# Patient Record
Sex: Female | Born: 1950 | Race: White | Marital: Married
Health system: Northeastern US, Academic
[De-identification: ages and names within clinical notes are randomized; demographics above are authoritative.]

## PROBLEM LIST (undated history)

## (undated) DIAGNOSIS — K449 Diaphragmatic hernia without obstruction or gangrene: Secondary | ICD-10-CM

## (undated) DIAGNOSIS — K219 Gastro-esophageal reflux disease without esophagitis: Secondary | ICD-10-CM

## (undated) DIAGNOSIS — R194 Change in bowel habit: Secondary | ICD-10-CM

## (undated) DIAGNOSIS — J45909 Unspecified asthma, uncomplicated: Secondary | ICD-10-CM

## (undated) DIAGNOSIS — G4730 Sleep apnea, unspecified: Secondary | ICD-10-CM

## (undated) DIAGNOSIS — F32A Depression, unspecified: Secondary | ICD-10-CM

## (undated) DIAGNOSIS — H409 Unspecified glaucoma: Secondary | ICD-10-CM

## (undated) DIAGNOSIS — Z5189 Encounter for other specified aftercare: Secondary | ICD-10-CM

## (undated) DIAGNOSIS — F419 Anxiety disorder, unspecified: Secondary | ICD-10-CM

## (undated) DIAGNOSIS — I639 Cerebral infarction, unspecified: Secondary | ICD-10-CM

## (undated) DIAGNOSIS — IMO0002 Reserved for concepts with insufficient information to code with codable children: Secondary | ICD-10-CM

## (undated) DIAGNOSIS — D649 Anemia, unspecified: Secondary | ICD-10-CM

## (undated) DIAGNOSIS — T7840XA Allergy, unspecified, initial encounter: Secondary | ICD-10-CM

## (undated) DIAGNOSIS — F319 Bipolar disorder, unspecified: Secondary | ICD-10-CM

## (undated) DIAGNOSIS — M1990 Unspecified osteoarthritis, unspecified site: Secondary | ICD-10-CM

## (undated) DIAGNOSIS — F329 Major depressive disorder, single episode, unspecified: Secondary | ICD-10-CM

## (undated) DIAGNOSIS — E079 Disorder of thyroid, unspecified: Secondary | ICD-10-CM

## (undated) HISTORY — DX: Reserved for concepts with insufficient information to code with codable children: IMO0002

## (undated) HISTORY — DX: Cerebral infarction, unspecified: I63.9

## (undated) HISTORY — DX: Allergy, unspecified, initial encounter: T78.40XA

## (undated) HISTORY — PX: SMALL INTESTINE SURGERY: SHX150

## (undated) HISTORY — DX: Anemia, unspecified: D64.9

## (undated) HISTORY — DX: Anxiety disorder, unspecified: F41.9

## (undated) HISTORY — DX: Unspecified osteoarthritis, unspecified site: M19.90

## (undated) HISTORY — PX: CHOLECYSTECTOMY: SHX55

## (undated) HISTORY — PX: TUBAL LIGATION: SHX77

## (undated) HISTORY — DX: Unspecified glaucoma: H40.9

## (undated) HISTORY — DX: Major depressive disorder, single episode, unspecified: F32.9

## (undated) HISTORY — DX: Depression, unspecified: F32.A

## (undated) HISTORY — PX: ABDOMINAL HYSTERECTOMY: SHX81

## (undated) HISTORY — DX: Encounter for other specified aftercare: Z51.89

## (undated) HISTORY — DX: Sleep apnea, unspecified: G47.30

## (undated) HISTORY — DX: Gastro-esophageal reflux disease without esophagitis: K21.9

## (undated) HISTORY — DX: Diaphragmatic hernia without obstruction or gangrene: K44.9

## (undated) HISTORY — DX: Change in bowel habit: R19.4

---

## 2016-06-21 DIAGNOSIS — K5669 Other intestinal obstruction: Secondary | ICD-10-CM | POA: Diagnosis not present

## 2016-06-21 DIAGNOSIS — G4700 Insomnia, unspecified: Secondary | ICD-10-CM | POA: Diagnosis not present

## 2016-06-21 DIAGNOSIS — F17200 Nicotine dependence, unspecified, uncomplicated: Secondary | ICD-10-CM | POA: Diagnosis not present

## 2016-06-21 DIAGNOSIS — Z79899 Other long term (current) drug therapy: Secondary | ICD-10-CM | POA: Diagnosis not present

## 2016-06-21 DIAGNOSIS — F3112 Bipolar disorder, current episode manic without psychotic features, moderate: Secondary | ICD-10-CM | POA: Diagnosis not present

## 2016-06-22 ENCOUNTER — Emergency Department (HOSPITAL_COMMUNITY): Payer: Medicare Other

## 2016-06-22 ENCOUNTER — Encounter (HOSPITAL_COMMUNITY): Payer: Self-pay | Admitting: *Deleted

## 2016-06-22 ENCOUNTER — Emergency Department (HOSPITAL_COMMUNITY)
Admission: EM | Admit: 2016-06-22 | Discharge: 2016-06-25 | Disposition: A | Discharge status: Other Acute Inpatient Hospital | Payer: Medicare Other | Attending: Internal Medicine | Admitting: Internal Medicine

## 2016-06-22 DIAGNOSIS — R109 Unspecified abdominal pain: Secondary | ICD-10-CM

## 2016-06-22 DIAGNOSIS — K56609 Unspecified intestinal obstruction, unspecified as to partial versus complete obstruction: Secondary | ICD-10-CM

## 2016-06-22 DIAGNOSIS — F3112 Bipolar disorder, current episode manic without psychotic features, moderate: Secondary | ICD-10-CM | POA: Diagnosis present

## 2016-06-22 DIAGNOSIS — G4700 Insomnia, unspecified: Secondary | ICD-10-CM

## 2016-06-22 HISTORY — DX: Bipolar disorder, unspecified: F31.9

## 2016-06-22 HISTORY — DX: Unspecified asthma, uncomplicated: J45.909

## 2016-06-22 HISTORY — DX: Disorder of thyroid, unspecified: E07.9

## 2016-06-22 LAB — RAPID URINE DRUG SCREEN, HOSP PERFORMED
Amphetamines: NOT DETECTED
Barbiturates: NOT DETECTED
Benzodiazepines: NOT DETECTED
Cocaine: NOT DETECTED
Opiates: NOT DETECTED
Tetrahydrocannabinol: NOT DETECTED

## 2016-06-22 LAB — CBC
HCT: 34.2 % — ABNORMAL LOW (ref 36.0–46.0)
HEMOGLOBIN: 11.4 g/dL — AB (ref 12.0–15.0)
MCH: 30.6 pg (ref 26.0–34.0)
MCHC: 33.3 g/dL (ref 30.0–36.0)
MCV: 91.9 fL (ref 78.0–100.0)
PLATELETS: 201 10*3/uL (ref 150–400)
RBC: 3.72 MIL/uL — AB (ref 3.87–5.11)
RDW: 14.7 % (ref 11.5–15.5)
WBC: 6 10*3/uL (ref 4.0–10.5)

## 2016-06-22 LAB — COMPREHENSIVE METABOLIC PANEL
ALBUMIN: 4.2 g/dL (ref 3.5–5.0)
ALK PHOS: 94 U/L (ref 38–126)
ALT: 47 U/L (ref 14–54)
ANION GAP: 10 (ref 5–15)
AST: 62 U/L — ABNORMAL HIGH (ref 15–41)
BUN: 7 mg/dL (ref 6–20)
CALCIUM: 9.9 mg/dL (ref 8.9–10.3)
CO2: 24 mmol/L (ref 22–32)
Chloride: 105 mmol/L (ref 101–111)
Creatinine, Ser: 1.03 mg/dL — ABNORMAL HIGH (ref 0.44–1.00)
GFR calc non Af Amer: 56 mL/min — ABNORMAL LOW (ref >60)
GLUCOSE: 119 mg/dL — AB (ref 65–99)
POTASSIUM: 3.7 mmol/L (ref 3.5–5.1)
SODIUM: 139 mmol/L (ref 135–145)
TOTAL PROTEIN: 6.7 g/dL (ref 6.5–8.1)
Total Bilirubin: 0.6 mg/dL (ref 0.3–1.2)

## 2016-06-22 LAB — I-STAT TROPONIN, ED: TROPONIN I, POC: 0 ng/mL (ref 0.00–0.08)

## 2016-06-22 LAB — LITHIUM LEVEL: LITHIUM LVL: 0.74 mmol/L (ref 0.60–1.20)

## 2016-06-22 LAB — ETHANOL: Alcohol, Ethyl (B): <5 mg/dL (ref <5)

## 2016-06-22 MED ORDER — LORAZEPAM 1 MG PO TABS: 1.0000 mg | ORAL_TABLET | Freq: Once | ORAL | Status: DC

## 2016-06-22 MED ORDER — ADULT MULTIVITAMIN W/MINERALS CH
1.0000 | ORAL_TABLET | Freq: Every day | ORAL | Status: DC
  Administered 2016-06-22 – 2016-06-25 (×4): 1 via ORAL
  Filled 2016-06-22 (×4): qty 1

## 2016-06-22 MED ORDER — OLANZAPINE 5 MG PO TBDP
5.0000 mg | ORAL_TABLET | Freq: Once | ORAL | Status: AC
  Administered 2016-06-22: 5 mg via ORAL
  Filled 2016-06-22: qty 1

## 2016-06-22 MED ORDER — CALCIUM CARBONATE-VITAMIN D 500-200 MG-UNIT PO TABS
1.0000 | ORAL_TABLET | Freq: Every day | ORAL | Status: DC
  Administered 2016-06-22 – 2016-06-25 (×4): 1 via ORAL
  Filled 2016-06-22 (×8): qty 1

## 2016-06-22 MED ORDER — DIPHENHYDRAMINE HCL 25 MG PO CAPS
25.0000 mg | ORAL_CAPSULE | Freq: Once | ORAL | Status: AC
  Administered 2016-06-22: 25 mg via ORAL
  Filled 2016-06-22: qty 1

## 2016-06-22 MED ORDER — LEVOTHYROXINE SODIUM 137 MCG PO TABS
137.0000 ug | ORAL_TABLET | Freq: Every day | ORAL | Status: DC
  Administered 2016-06-22 – 2016-06-25 (×4): 137 ug via ORAL
  Filled 2016-06-22 (×8): qty 1

## 2016-06-22 MED ORDER — TRAZODONE HCL 100 MG PO TABS
200.0000 mg | ORAL_TABLET | Freq: Every day | ORAL | Status: DC
  Administered 2016-06-23 – 2016-06-24 (×3): 200 mg via ORAL
  Filled 2016-06-22 (×4): qty 2

## 2016-06-22 MED ORDER — LITHIUM CARBONATE ER 450 MG PO TBCR
450.0000 mg | EXTENDED_RELEASE_TABLET | Freq: Two times a day (BID) | ORAL | Status: DC
  Administered 2016-06-22 – 2016-06-24 (×5): 450 mg via ORAL
  Filled 2016-06-22 (×7): qty 1
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
## 2016-06-22 NOTE — ED Provider Notes (Signed)
On my assessment, this morning, patient is requesting discharge. She is tangential, with pressured speech and flight of ideas. She shows little to no insight as to her current condition and swears that she has no "mental issues." Per review of Behavioral Health records, patient does meet inpatient criteria. Will place IVC and continue seeking inpatient care.  Patient IVC'ed. She has been accepted to an outside facility, so chest x-ray and EKG obtained. Of note, EKG shows mildly prolonged QT as well as ST elevations in V1 and V2. There are no reciprocal changes. This may be secondary to QT prolongation versus chronic changes versus lithium. I discussed with Dr. Delton SeeNelson of cardiology. Repeat EKG is unchanged with the exception of continued prolonged QTC. Trop negative and no further cardiology work-up required per Dr. Delton SeeNelson.   Shaune Pollackameron Lorrain Rivers, MD 06/22/16 1556

## 2016-06-22 NOTE — ED Triage Notes (Signed)
The pt is here with family because the pt has b not been able to sleep since she left a [facility] one wek ago.  She has been restless and feels sleepy but cannot rest

## 2016-06-22 NOTE — ED Notes (Signed)
[facility] of [facility] MD plans to [facility] patient.

## 2016-06-22 NOTE — ED Notes (Signed)
Pt given dinner tray.  Sitting in chair eating.  Sitter at bedside.

## 2016-06-22 NOTE — ED Notes (Signed)
Pt changed out and security paged to wand pt

## 2016-06-22 NOTE — ED Notes (Signed)
Patient had breakfast. Is resting at current. RN able to visualize patient, unlabored respirations.

## 2016-06-22 NOTE — ED Notes (Signed)
Pt reports allergy to ativan that makes her hallucinate. Order d/c.
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
## 2016-06-22 NOTE — ED Notes (Signed)
Staffing made aware of need for sitter. Dr. Erma HeritageIsaacs is taking out IVC papers.

## 2016-06-22 NOTE — [facility] Counselor (Signed)
Attempted to reassess Pt.  She was sleeping after being given benadryl.  Per attending nurse, Pt reported to have up for four days.  Pt is now under IVC (petition by attending physician) due to incoherent thought.

## 2016-06-22 NOTE — ED Triage Notes (Signed)
PT HAVING DIARRHEA AND VOMITING

## 2016-06-22 NOTE — BH Assessment (Signed)
Assessor sent inpatient referrals to the following for review: [facility], [facility], [address], [facility], [place], [facility].  Princess BruinsAquicha Duff, MSW, Theresia MajorsLCSWA

## 2016-06-22 NOTE — ED Notes (Signed)
Meal tray delivered.

## 2016-06-22 NOTE — ED Notes (Signed)
Pt complaining to RN she can't sleep, is exhausted. Is yelling, agitated easily with questioning. Dr. Rhunette CroftNanavati provided verbal order for anxiety medication.

## 2016-06-22 NOTE — ED Provider Notes (Signed)
[facility]-EMERGENCY DEPT Provider Note   CSN: [id number] Arrival date & time: 06/21/16  2350     History   Chief Complaint Chief Complaint  Patient presents with  . Psychiatric Evaluation    HPI Lydia Morgan is a 65 y.o. female.  65 year old female with a history of bipolar 1 disorder and thyroid disease presents to the emergency department for evaluation of insomnia. She reports that she has not slept in 4 days. She is on lithium, Effexor, trazodone, and O thyroxine. She reports compliance with these medications. She was recently discharged from a [facility] in [place] where she spent one week. She has been able to doze off for a few minutes, but then will wake up. She denies SI/HI and illicit drug use or ETOH use. Family reports that the patient has been increasingly combative with her son with whom she lives. She c/o diarrhea and nausea/vomiting, but these have been chronic complaints of the patient for "years".   The history is provided by the patient. No language interpreter was used.    Past Medical History:  Diagnosis Date  . Bipolar 1 disorder (HCC)   . Thyroid disease     There are no active problems to display for this patient.   History reviewed. No pertinent surgical history.  OB History    No data available       Home Medications    Prior to Admission medications   Medication Sig Start Date End Date Taking? Authorizing Provider  calcium-vitamin D (OSCAL WITH D) 500-200 MG-UNIT tablet Take 1 tablet by mouth every morning.   Yes Historical Provider, MD  levothyroxine (SYNTHROID, LEVOTHROID) 137 MCG tablet Take 137 mcg by mouth daily before breakfast.   Yes Historical Provider, MD  lithium carbonate (ESKALITH) 450 MG CR tablet Take 450-675 mg by mouth 2 (two) times daily. 450mg  in the morning and 675mg  in the evening.   Yes Historical Provider, MD  Multiple Vitamin (MULTIVITAMIN WITH MINERALS) TABS tablet Take 1 tablet by mouth daily.   Yes  Historical Provider, MD  traZODone (DESYREL) 100 MG tablet Take 200 mg by mouth at bedtime.   Yes Historical Provider, MD    Family History No family history on file.  Social History Social History  Substance Use Topics  . Smoking status: Current Every Day Smoker  . Smokeless tobacco: Never Used  . Alcohol use Not on file     Allergies   Ambien [zolpidem tartrate]   Review of Systems Review of Systems  Psychiatric/Behavioral: Positive for behavioral problems and sleep disturbance.  Ten systems reviewed and are negative for acute change, except as noted in the HPI.     Physical Exam Updated Vital Signs BP 118/61   Pulse 61   Temp 97.9 F (36.6 C) (Oral)   Resp 16   Ht 5\' 7"  (1.702 m)   Wt 71.7 kg   SpO2 98%   BMI 24.75 kg/m   Physical Exam  Constitutional: She is oriented to person, place, and time. She appears well-developed and well-nourished. No distress.  Nontoxic appearing  HENT:  Head: Normocephalic and atraumatic.  Eyes: Conjunctivae and EOM are normal. No scleral icterus.  Neck: Normal range of motion.  Cardiovascular: Normal rate, regular rhythm and intact distal pulses.   Pulmonary/Chest: Effort normal. No respiratory distress.  Abdominal: Soft. She exhibits no distension. There is no tenderness. There is no guarding.  Soft, nontender abdomen.  Musculoskeletal: Normal range of motion.  Neurological: She is alert and oriented  to person, place, and time.  GCS 15. Patient moving all extremities.  Skin: Skin is warm and dry. No rash noted. She is not diaphoretic. No erythema. No pallor.  Psychiatric: Her speech is normal. She is agitated (mild). She expresses no homicidal and no suicidal ideation.  Nursing note and vitals reviewed.    ED Treatments / Results  Labs (all labs ordered are listed, but only abnormal results are displayed) Labs Reviewed  COMPREHENSIVE METABOLIC PANEL - Abnormal; Notable for the following:       Result Value   Glucose,  Bld 119 (*)    Creatinine, Ser 1.03 (*)    AST 62 (*)    GFR calc non Af Amer 56 (*)    All other components within normal limits  CBC - Abnormal; Notable for the following:    RBC 3.72 (*)    Hemoglobin 11.4 (*)    HCT 34.2 (*)    All other components within normal limits  ETHANOL  URINE RAPID DRUG SCREEN, HOSP PERFORMED  LITHIUM LEVEL    EKG  EKG Interpretation None       Radiology No results found.  Procedures Procedures (including critical care time)  Medications Ordered in ED Medications - No data to display   Initial Impression / Assessment and Plan / ED Course  I have reviewed the triage vital signs and the nursing notes.  Pertinent labs & imaging results that were available during my care of the patient were reviewed by me and considered in my medical decision making (see chart for details).  Clinical Course    Patient medically cleared. She has been evaluated by TTS and meets inpatient criteria. Placement pending. Disposition to be determined by oncoming ED provider.   Final Clinical Impressions(s) / ED Diagnoses   Final diagnoses:  Insomnia    New Prescriptions New Prescriptions   No medications on file     Antony MaduraKelly Roberta Angell, PA-C 06/22/16 [id number]    Derwood KaplanAnkit Nanavati, MD 06/22/16 [phone]

## 2016-06-22 NOTE — ED Notes (Signed)
Dr. Erma HeritageIsaacs in to bedside to with patient

## 2016-06-22 NOTE — ED Notes (Signed)
[facility] requesting EKG and Chest xray, which will increase her eligibility for placement in geriatric facilities. Dr. Erma HeritageIsaacs agreeable.

## 2016-06-23 ENCOUNTER — Encounter (HOSPITAL_COMMUNITY): Payer: Self-pay | Admitting: Radiology

## 2016-06-23 ENCOUNTER — Emergency Department (HOSPITAL_COMMUNITY): Payer: Medicare Other

## 2016-06-23 DIAGNOSIS — R109 Unspecified abdominal pain: Secondary | ICD-10-CM | POA: Diagnosis present

## 2016-06-23 DIAGNOSIS — K56609 Unspecified intestinal obstruction, unspecified as to partial versus complete obstruction: Secondary | ICD-10-CM | POA: Diagnosis present

## 2016-06-23 LAB — CBC WITH DIFFERENTIAL/PLATELET
BASOS PCT: 0 %
Basophils Absolute: 0 10*3/uL (ref 0.0–0.1)
EOS ABS: 0.2 10*3/uL (ref 0.0–0.7)
Eosinophils Relative: 4 %
HCT: 34.1 % — ABNORMAL LOW (ref 36.0–46.0)
HEMOGLOBIN: 11.4 g/dL — AB (ref 12.0–15.0)
Lymphocytes Relative: 26 %
Lymphs Abs: 1.7 10*3/uL (ref 0.7–4.0)
MCH: 30.8 pg (ref 26.0–34.0)
MCHC: 33.4 g/dL (ref 30.0–36.0)
MCV: 92.2 fL (ref 78.0–100.0)
Monocytes Absolute: 0.6 10*3/uL (ref 0.1–1.0)
Monocytes Relative: 9 %
NEUTROS PCT: 61 %
Neutro Abs: 4 10*3/uL (ref 1.7–7.7)
Platelets: 194 10*3/uL (ref 150–400)
RBC: 3.7 MIL/uL — AB (ref 3.87–5.11)
RDW: 15 % (ref 11.5–15.5)
WBC: 6.5 10*3/uL (ref 4.0–10.5)

## 2016-06-23 LAB — COMPREHENSIVE METABOLIC PANEL
ALBUMIN: 3.7 g/dL (ref 3.5–5.0)
ALK PHOS: 101 U/L (ref 38–126)
ALT: 40 U/L (ref 14–54)
ANION GAP: 6 (ref 5–15)
AST: 47 U/L — ABNORMAL HIGH (ref 15–41)
BUN: 10 mg/dL (ref 6–20)
CALCIUM: 8.7 mg/dL — AB (ref 8.9–10.3)
CHLORIDE: 109 mmol/L (ref 101–111)
CO2: 21 mmol/L — AB (ref 22–32)
Creatinine, Ser: 0.93 mg/dL (ref 0.44–1.00)
GFR calc Af Amer: >60 mL/min (ref >60)
GFR calc non Af Amer: >60 mL/min (ref >60)
GLUCOSE: 169 mg/dL — AB (ref 65–99)
Potassium: 3.5 mmol/L (ref 3.5–5.1)
SODIUM: 136 mmol/L (ref 135–145)
Total Bilirubin: 0.3 mg/dL (ref 0.3–1.2)
Total Protein: 5.8 g/dL — ABNORMAL LOW (ref 6.5–8.1)

## 2016-06-23 MED ORDER — SODIUM CHLORIDE 0.9 % IV BOLUS (SEPSIS)
1000.0000 mL | Freq: Once | INTRAVENOUS | Status: AC
  Administered 2016-06-23: 1000 mL via INTRAVENOUS

## 2016-06-23 MED ORDER — IOPAMIDOL (ISOVUE-300) INJECTION 61%
INTRAVENOUS | Status: AC
  Administered 2016-06-23: 100 mL
  Filled 2016-06-23: qty 100

## 2016-06-23 MED ORDER — ONDANSETRON HCL 4 MG/2ML IJ SOLN
4.0000 mg | Freq: Once | INTRAMUSCULAR | Status: AC
  Administered 2016-06-23: 4 mg via INTRAVENOUS
  Filled 2016-06-23: qty 2

## 2016-06-23 NOTE — [facility] Counselor (Signed)
Reassessment   Pt states that she is ready to go home and has been feeling "great, and talking with her sitter". Pt says that she is upset with her kids and her kids dog who has been "chewing on her furniture". She states that's why she hasn't been sleeping- because she needed to "keep watch on her furniture". Pt speech is rapid and she is irritated during reassessment. She states that she is 65 years old and no one helps her. She states her kids call her the "energizer bunny" and she admits that she has a lot of energy. Pt has a history of Bipolar disorder and has been taking Lithium for years she states. She states that [place] and does not currently have a psychiatrist in the area. She states that she has "plenty of medication" left [place] however her children state that she isn't taking her medications. She admits that she thinks her medications might not be working the way they used to because she has been on the same dose for a long time. However she does not want to go inpatient. Pt is currently under IVC and the recommended disposition in inpatient treatment. Pt still meets inpatient criteria today per Lydia KaufmannLaura Davis NP. TTS still seeking treatment due to possible manic episode and need for medication management and stabilization.  [address] LaneKristin Arshia Morgan, LPCA, [place],

## 2016-06-23 NOTE — ED Notes (Signed)
TTS at bedside with patient.   

## 2016-06-23 NOTE — [facility] Counselor (Signed)
[facility] Assessment Progress Note  [facility] Accepting doctor is Saeed. Call report to [phone]. Pt can come anytime after 10am tomorrow (06/24/16). RN, Florentina AddisonKatie, notified.   Johny ShockSamantha M. Ladona Ridgelaylor, MS, NCC, LPCA Counselor

## 2016-06-23 NOTE — ED Notes (Signed)
Patient made 1 - 5 minute phone call.

## 2016-06-23 NOTE — Progress Notes (Signed)
[facility] at [facility] called to advise admitting MD declined pt's referral due to "lack of acute symptoms. Appears to be mania only."

## 2016-06-23 NOTE — ED Notes (Signed)
RN and MD at bedside updating patient.  Patient refusing NG tube at this time.  MD okay'd but will update with any continued vomiting.

## 2016-06-23 NOTE — ED Notes (Signed)
Patient was given graham crackers and peanut butter with diet coke,and a regular diet ordered for lunch.

## 2016-06-23 NOTE — ED Notes (Signed)
Pt vomiting in room.  sts "this happens sometimes when food gets caught".  Pt laying sideways on bed and attempting to force herself to vomit.  Will speak to MD

## 2016-06-23 NOTE — Consult Note (Signed)
Reason for Consult:abdominal pain, possible SBO Referring Physician: Dr. Shirlyn Goltz  Lydia Morgan is an 65 y.o. female.  HPI: I have been asked to evaluate this patient who is been in the emergency department approximately 2 days for a possible bowel obstruction. She actually has chronic abdominal pain with chronic nausea, vomiting, and diarrhea. I was asked to see her because of apparent diffuse abdominal pain and vomiting. Currently, she is awake and alert and denies any abdominal pain. She has a significant history of multiple abdominal procedures in the past. Apparently, she has some kind of bowel stimulator as well. Again, she currently is denying any abdominal pain.  Past Medical History:  Diagnosis Date  . Asthma   . Bipolar 1 disorder ([place])   . Thyroid disease     History reviewed. No pertinent surgical history.  No family history on file.  Social History:  reports that she has been smoking.  She has never used smokeless tobacco. Her alcohol and drug histories are not on file.  Allergies:  Allergies  Allergen Reactions  . Ambien [Zolpidem Tartrate] Other (See Comments)    Hallucinations  . Ativan [Lorazepam] Other (See Comments)    hallucinations    Medications: I have reviewed the patient's current medications.  Results for orders placed or performed during the hospital encounter of 06/22/16 (from the past 48 hour(s))  Rapid urine drug screen (hospital performed)     Status: None   Collection Time: 06/22/16 12:08 AM  Result Value Ref Range   Opiates NONE DETECTED NONE DETECTED   Cocaine NONE DETECTED NONE DETECTED   Benzodiazepines NONE DETECTED NONE DETECTED   Amphetamines NONE DETECTED NONE DETECTED   Tetrahydrocannabinol NONE DETECTED NONE DETECTED   Barbiturates NONE DETECTED NONE DETECTED    Comment:        DRUG SCREEN FOR MEDICAL PURPOSES ONLY.  IF CONFIRMATION IS NEEDED FOR ANY PURPOSE, NOTIFY LAB WITHIN 5 DAYS.        LOWEST DETECTABLE LIMITS FOR  URINE DRUG SCREEN Drug Class       Cutoff (ng/mL) Amphetamine      1000 Barbiturate      200 Benzodiazepine   756 Tricyclics       433 Opiates          300 Cocaine          300 THC              50   Comprehensive metabolic panel     Status: Abnormal   Collection Time: 06/22/16 12:15 AM  Result Value Ref Range   Sodium 139 135 - 145 mmol/L   Potassium 3.7 3.5 - 5.1 mmol/L   Chloride 105 101 - 111 mmol/L   CO2 24 22 - 32 mmol/L   Glucose, Bld 119 (H) 65 - 99 mg/dL   BUN 7 6 - 20 mg/dL   Creatinine, Ser 1.03 (H) 0.44 - 1.00 mg/dL   Calcium 9.9 8.9 - 10.3 mg/dL   Total Protein 6.7 6.5 - 8.1 g/dL   Albumin 4.2 3.5 - 5.0 g/dL   AST 62 (H) 15 - 41 U/L   ALT 47 14 - 54 U/L   Alkaline Phosphatase 94 38 - 126 U/L   Total Bilirubin 0.6 0.3 - 1.2 mg/dL   GFR calc non Af Amer 56 (L) >60 mL/min   GFR calc Af Amer >60 >60 mL/min    Comment: (NOTE) The eGFR has been calculated using the CKD EPI equation. This calculation has  not been validated in all clinical situations. eGFR's persistently <60 mL/min signify possible Chronic Kidney Disease.    Anion gap 10 5 - 15  Ethanol     Status: None   Collection Time: 06/22/16 12:15 AM  Result Value Ref Range   Alcohol, Ethyl (B) <5 <5 mg/dL    Comment:        LOWEST DETECTABLE LIMIT FOR SERUM ALCOHOL IS 5 mg/dL FOR MEDICAL PURPOSES ONLY   cbc     Status: Abnormal   Collection Time: 06/22/16 12:15 AM  Result Value Ref Range   WBC 6.0 4.0 - 10.5 K/uL   RBC 3.72 (L) 3.87 - 5.11 MIL/uL   Hemoglobin 11.4 (L) 12.0 - 15.0 g/dL   HCT 34.2 (L) 36.0 - 46.0 %   MCV 91.9 78.0 - 100.0 fL   MCH 30.6 26.0 - 34.0 pg   MCHC 33.3 30.0 - 36.0 g/dL   RDW 14.7 11.5 - 15.5 %   Platelets 201 150 - 400 K/uL  Lithium level     Status: None   Collection Time: 06/22/16  4:09 AM  Result Value Ref Range   Lithium Lvl 0.74 0.60 - 1.20 mmol/L  I-Stat Troponin, ED (not at [facility])     Status: None   Collection Time: 06/22/16 12:36 PM  Result Value Ref Range    Troponin i, poc 0.00 0.00 - 0.08 ng/mL   Comment 3            Comment: Due to the release kinetics of cTnI, a negative result within the first hours of the onset of symptoms does not rule out myocardial infarction with certainty. If myocardial infarction is still suspected, repeat the test at appropriate intervals.   CBC with Differential/Platelet     Status: Abnormal   Collection Time: 06/23/16  8:06 PM  Result Value Ref Range   WBC 6.5 4.0 - 10.5 K/uL   RBC 3.70 (L) 3.87 - 5.11 MIL/uL   Hemoglobin 11.4 (L) 12.0 - 15.0 g/dL   HCT 34.1 (L) 36.0 - 46.0 %   MCV 92.2 78.0 - 100.0 fL   MCH 30.8 26.0 - 34.0 pg   MCHC 33.4 30.0 - 36.0 g/dL   RDW 15.0 11.5 - 15.5 %   Platelets 194 150 - 400 K/uL   Neutrophils Relative % 61 %   Neutro Abs 4.0 1.7 - 7.7 K/uL   Lymphocytes Relative 26 %   Lymphs Abs 1.7 0.7 - 4.0 K/uL   Monocytes Relative 9 %   Monocytes Absolute 0.6 0.1 - 1.0 K/uL   Eosinophils Relative 4 %   Eosinophils Absolute 0.2 0.0 - 0.7 K/uL   Basophils Relative 0 %   Basophils Absolute 0.0 0.0 - 0.1 K/uL    Dg Chest 2 View  Result Date: 06/22/2016 CLINICAL DATA:  Psychiatric patient.  Altered mental status. EXAM: CHEST  2 VIEW COMPARISON:  None. FINDINGS: Normal cardiac and mediastinal contours. No consolidative pulmonary opacities. No pleural effusion or pneumothorax. Upper abdominal surgical clips. Lysis of the distal left clavicle and widening of the left AC joint. IMPRESSION: No acute cardiopulmonary process. Lysis of the distal left clavicle and widening of the left AC joint, likely sequelae of prior trauma. Recommend correlation with patient history. Electronically Signed   By: Lovey Newcomer M.D.   On: 06/22/2016 11:43   Ct Abdomen Pelvis W Contrast  Result Date: 06/23/2016 CLINICAL DATA:  Abdominal pain and diarrhea EXAM: CT ABDOMEN AND PELVIS WITH CONTRAST TECHNIQUE: Multidetector CT  imaging of the abdomen and pelvis was performed using the standard protocol following  bolus administration of intravenous contrast. CONTRAST:  132m ISOVUE-300 IOPAMIDOL (ISOVUE-300) INJECTION 61% COMPARISON:  None. FINDINGS: Lower chest: Multiple nodules are present in the right lung base. These are subpleural in location and measure 3 - 6 mm in diameter. Approximately 5 nodules in the right lung base are present. There is also some linear scarring in the right posterior lung base with associated calcification. Solitary 3 mm nodule left lower lobe posteriorly. No pleural effusion. Heart size normal. Hepatobiliary: Cholecystectomy. 1 cm round hypodensity left lobe liver probably a cyst. 8 x 15 mm ill-defined hypodensity in the inferior right lobe liver anteriorly adjacent to the liver capsule is indeterminate. No other liver lesions identified. Pancreas: Negative Spleen: Negative Adrenals/Urinary Tract: Normal renal size and contour. Right lower pole cyst 15 mm. No mass or stone in the kidneys. Urinary bladder normal. Stomach/Bowel: Prior gastric surgery with surgical clips. Colectomy. Ileo colic anastomosis appears to be at the level of the sigmoid colon. Dilated loop of small bowel in the left mid abdomen near a suture line. This measures 4 cm in diameter and could represent a closed loop obstruction. This is relatively isolated and the remainder of the small bowel is not dilated or thickened. Vascular/Lymphatic: Negative Reproductive: Hysterectomy.  No pelvic mass. Other: No free fluid.  Negative for hernia. Musculoskeletal: No acute skeletal abnormality. IMPRESSION: Multiple lung nodules, under 7 mm in size. These could be due to metastatic disease or chronic infection. CT of the chest with contrast is recommended to evaluate for other nodules or lung mass. 8 x 15 mm ill-defined hypodensity in the right lower lip liver anteriorly is indeterminate. Probable 1 cm cyst in the left lobe of the liver. Prior gastric surgery. Colectomy with ileocolic anastomosis in the sigmoid colon. Focal short segment  dilated small bowel loop near anastomosis in the left abdomen could represent a closed loop bowel obstruction. Electronically Signed   By: CFranchot GalloM.D.   On: 06/23/2016 19:05    Review of Systems  All other systems reviewed and are negative.  Blood pressure 123/77, pulse 70, temperature 98.5 F (36.9 C), temperature source Oral, resp. rate 19, height '5\' 7"'$  (1.702 m), weight 71.7 kg (158 lb), SpO2 100 %. Physical Exam  Constitutional: She appears well-developed and well-nourished. No distress.  Cardiovascular: Normal rate, regular rhythm and normal heart sounds.   Respiratory: Effort normal and breath sounds normal. No respiratory distress.  GI: Soft. She exhibits no distension. There is no tenderness. There is no rebound and no guarding.  She has multiple well-healed incisions. Her abdomen is completely soft and nontender with no guarding or distention  Skin: She is not diaphoretic.    Assessment/Plan: Abdominal pain, multifactorial  Clinically, she does not have an acute abdomen or an obstruction. I have reviewed the CT scan and I do not believe she has a closed loop obstruction. Again, she is nontender and has a normal white blood count. I believe the dilated bowel represents a side-to-side anastomosis and not an obstruction. There is no dilated bowel proximal to this. Given her abdominal exam, I would not even place an NG tube. I'm not even sure she needs admission. At the most, she needs a repeat abdominal x-ray tomorrow to evaluate for obstructive pattern and the contrast from the CT scan.  Bow Buntyn A 06/23/2016, 8:35 PM

## 2016-06-23 NOTE — Progress Notes (Signed)
Lydia Morgan called and advised referral is being reviewed for possible admission.  Ilean SkillMeghan Abigale Dorow, MSW, LCSW Clinical Social Work, Disposition  06/23/2016 [phone]

## 2016-06-23 NOTE — ED Notes (Signed)
Pt c/o of redness to her bottom; RN assessed with no obvious redness or bruising to buttock; RN applied barrier cream for pt's comfort

## 2016-06-23 NOTE — ED Notes (Signed)
Paged general surgery for Dr. Roderic PalauYao-TY

## 2016-06-23 NOTE — Progress Notes (Signed)
[facility] to call pt's son, Lydia Morgan [phone], who is her POA (per his report) and had questions re: [facility]commendations.   CSW spoke with Mr. Archer AsaHermanowski and relayed that [facility] and that she is under IVC. Son understanding and agrees that he feels pt needs inpatient admission as "[facility] for this same thing. She is not sleeping, doesn't make sense when she talks, does not sit down, never stops moving." Son concerned that medication regimen is ineffective or that "her body is not metabolizing medications right." Son shares concerns about pt "having diarrhea, and urinating on herself" (these symptoms have not presented in ED per ED RN and chart review), "she has a pacemaker in her ?back and it needs to be looked at, something is off with her medically." CSW relayed son's concerns to ED.   Son states pt moved to [place] area 3 weeks ago, (to be nearer family) and that she "had an appointment set up with a psychiatrist this week, can't remember where." States pt has hx of treatment for bipolar d/o, both inpatient and outpatient.  Son understands that, as pt under IVC, POA consent for transfer is not needed, however, he states he is primary support for pt and requests he be called to notify of transfer once inpatient bed is found (or with further plan of care updates).   CSW provided son with contact numbers for CSW and for [facility]. Will continue following case.   Ilean SkillMeghan Kataleya Zaugg, MSW, LCSW Clinical Social Work, Disposition  06/23/2016 [phone]

## 2016-06-23 NOTE — ED Notes (Signed)
Patient on phone with son (Donald) [phone].  Donald asked to speak with this RN.  Advised by son that we wDorinda Hillere not able to move the patient without his consent due to Dorinda HillDonald being patients Power of [employer].  This RN advised Dorinda HillDonald that I would have [facility] consult with him regarding mothers status.    Mayara was upset and crying on the phone, stating "You need to have a family meeting to understand why I am upset and what dog is chewing my furniture".  Requesting son to have him bring clothes so she could change.  Advised son that patient would remain in our scrubs until either discharged or transferred.

## 2016-06-23 NOTE — Progress Notes (Addendum)
Referred pt for inpatient treatment, recommended by TTS 9/17.   [facility]- sent referral and left voicemail for intake [facility]- left voicemail inquiring as to status of referral sent 9/17 [facility]- per Cece, no beds currently but fax for review for waiting list [facility]- per Jamesetta SoPhyllis [facility]- per Adolph Pollackrent  Perla Echavarria, MSW, LCSW Clinical Social Work, Disposition  06/23/2016 [phone]

## 2016-06-23 NOTE — ED Notes (Signed)
Patient and this RN spoke to pt's niece.  Asked she be added to chart to accept phone calls and be given updates

## 2016-06-23 NOTE — ED Provider Notes (Addendum)
  Physical Exam  BP 126/75 (BP Location: Left Arm)   Pulse 68   Temp 98.5 F (36.9 C) (Oral)   Resp 20   Ht 5\' 7"  (1.702 m)   Wt 158 lb (71.7 kg)   SpO2 100%   BMI 24.75 kg/m   Physical Exam  ED Course  Procedures  MDM I was called around 4pm regarding patient. She started vomiting and has chronic diarrhea. She also has abdominal pain. She state that she has a "stimulator for her bowels" that is not working well. Will get CT ab/pel to assess the location of the stimulator and r/o SBO. She has hx of gastric bypass.   7:55 PM CT showed possible small loop obstruction. I consulted Dr. Magnus IvanBlackman from surgery. He reviewed images and doesn't think she has small loop obstruction. Recommend NG tube. Patient refused NG tube. Given IVF, zofran. Repeat labs. He recommend medical admission and repeat xray tomorrow.   11:03 PM Dr. Magnus IvanBlackman saw patient. Felt that the CT changes are chronic and recommend repeat xray tomorrow morning. Dr. Toniann FailKakrakandy accepted patient initially but won't admit given Dr. Eliberto IvoryBlackman's recommendation. Will get xray at 7am and if stable, still proceed with current plan. Will let overnight doctor, Dr. Blinda LeatherwoodPollina aware.    Charlynne Panderavid Hsienta Yao, MD 06/23/16 1956    Charlynne Panderavid Hsienta Yao, MD 06/23/16 [phone]

## 2016-06-23 NOTE — ED Notes (Signed)
MD at bedside updating pt

## 2016-06-23 NOTE — ED Notes (Signed)
[facility] that we need to call son, Dorinda HillDonald back regarding patients status and give an update on son's concerns of mother.

## 2016-06-24 ENCOUNTER — Inpatient Hospital Stay (HOSPITAL_COMMUNITY): Payer: Medicare Other

## 2016-06-24 MED ORDER — TRAZODONE HCL 100 MG PO TABS: 200.0000 mg | ORAL_TABLET | Freq: Every day | ORAL | Status: DC

## 2016-06-24 MED ORDER — LITHIUM CARBONATE ER 450 MG PO TBCR
675.0000 mg | EXTENDED_RELEASE_TABLET | Freq: Every evening | ORAL | Status: DC
  Administered 2016-06-24: 675 mg via ORAL

## 2016-06-24 MED ORDER — LITHIUM CARBONATE ER 450 MG PO TBCR
450.0000 mg | EXTENDED_RELEASE_TABLET | Freq: Every day | ORAL | Status: DC
  Administered 2016-06-25: 450 mg via ORAL
  Filled 2016-06-24: qty 1

## 2016-06-24 NOTE — ED Provider Notes (Signed)
Patient is medically cleared for transfer [facility]. Patient had been assessed overnight for abdominal pain and possible small bowel obstruction. Dr. Magnus IvanBlackman did not feel that this represented acute changes and was chronic. A repeat x-ray was done this morning which did not show any acute findings. These were the criteria for medically clearing her abdominal pain. I have also assessed the patient today and find that she shows no signs of abdominal discomfort. She is up and ambulatory. Patient is very well in appearance. I palpated the abdomen and she has no pain response. She identifies diarrhea as being a very chronic condition for her.  Patient was very descriptive about problems she is having with family members at home. She believes this is more their issue than hers. I have reviewed the Behavioral Health notes and it was felt she meets inpatient criteria. Notes indicate likely decompensated bipolar exacerbation with 4 nights without sleeping and aggressive and hostile behaviors. Patient's speech was alert and appropriate but very pressured. At this time we will proceed with the plan of [facility].   Arby BarretteMarcy Harriett Azar, MD 06/24/16 [phone]

## 2016-06-24 NOTE — ED Notes (Signed)
Pt came to desk requesting IV be d/c'd.  Sitter/tech will d/c IV and document.

## 2016-06-24 NOTE — Progress Notes (Signed)
Attempted calling pt's son Radene OuDonald Hermanowski [phone] to update him on pt's pending transfer [facility]. Outgoing message states voicemail box not set up. Will continue calling.  Ilean SkillMeghan Letti Towell, MSW, LCSW Clinical Social Work, Disposition  06/24/2016 [phone]

## 2016-06-24 NOTE — ED Notes (Signed)
Changed Trazodone and Lithium to 2000 time.

## 2016-06-24 NOTE — ED Notes (Signed)
Patient was given a snack and a drink. A regular diet was ordered for Lunch.

## 2016-06-24 NOTE — ED Notes (Signed)
Per Dr. Donnald GarrePfeiffer, patient is medically cleared to go [facility].   Meghan, social work - states [facility] - Dr. Roselle LocusSaeed will be the accepting doctor.  Call report to [phone].

## 2016-06-24 NOTE — ED Triage Notes (Signed)
PT's SON and Grandson  in room . PT .

## 2016-06-24 NOTE — Progress Notes (Signed)
Called pt's son Dorinda HillDonald to inform him that pt is no longer being transferred [facility] (see RN note- pt declined due to bowel/bladder stimulator). Son requests [facility] Clinical [facility] (spoke with Flavia ShipperSusan- Tonya with intake out of office). Darl PikesSusan advises likely pt would be inappropriate for their behavioral unit due to stimulator but advises fax referral for team to review.  Pt has also been declined at [facility] (due to medical acuity pe Colleen), [facility] (due to not meeting inpatient criteria per Jonny RuizJohn, lack of acute symptoms), and [facility] (medically acute for unit).   Ilean SkillMeghan Illyana Schorsch, MSW, LCSW Clinical Social Work, Disposition  06/24/2016 [phone]

## 2016-06-24 NOTE — ED Notes (Signed)
Called pharmacy to split order for Lithium morning and evening doses.  Pt reports takes all her "nighttime" meds @ 8pm.

## 2016-06-24 NOTE — ED Notes (Signed)
Tried to call [facility] and they advised that they cannot take the patient due to her having a stimulator for bowel and bladder.  They did not receive information on the stimulator in the initial referral to [facility].  Called Meghan, social work and advised that patient would not be able [facility].   They will continue to look for placement per Meghan.

## 2016-06-24 NOTE — ED Notes (Signed)
9 PM snack offered to pt, pt refused snack.

## 2016-06-24 NOTE — Progress Notes (Signed)
Spoke with pt's son Dorinda HillDonald [phone]- informed him of pt's pending transfer [facility]. Son states he will call back to get contact information for [facility] in order to follow up with treatment team there once pt transferred.   Ilean SkillMeghan Jaelah Hauth, MSW, LCSW Clinical Social Work, Disposition  06/24/2016 [phone]
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
## 2016-06-25 DIAGNOSIS — F3112 Bipolar disorder, current episode manic without psychotic features, moderate: Secondary | ICD-10-CM | POA: Diagnosis present

## 2016-06-25 DIAGNOSIS — F329 Major depressive disorder, single episode, unspecified: Secondary | ICD-10-CM | POA: Insufficient documentation

## 2016-06-25 DIAGNOSIS — F32A Depression, unspecified: Secondary | ICD-10-CM | POA: Insufficient documentation

## 2016-06-25 MED ORDER — OLANZAPINE 5 MG PO TBDP
5.0000 mg | ORAL_TABLET | Freq: Three times a day (TID) | ORAL | Status: DC | PRN
  Administered 2016-06-25: 5 mg via ORAL
  Filled 2016-06-25: qty 1
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
## 2016-06-25 NOTE — ED Notes (Signed)
Breakfast tray ordered at 0546-TY  

## 2016-06-25 NOTE — ED Notes (Signed)
Discontinued CIWA assessment order, pt reports no ETOH in last year and ethanol negative.

## 2016-06-25 NOTE — ED Notes (Signed)
A snack and drink was given to patient, and a Regular diet ordered for Lunch.

## 2016-06-25 NOTE — Progress Notes (Addendum)
[facility] [place] [facility], bed 152-1, by Dr. Lelon MastSamantha Call. Number to call report is [phone]. Pt can arrive anytime before 11pm, and if transport not available by that time, bed would be held until tomorrow morning 6am per Britta MccreedyBarbara in intake.  Attempted to call son to inform him- [phone]- voicemail box not set up. Will continue to attempt contact.  Ilean SkillMeghan Damonte Frieson, MSW, LCSW Clinical Social Work, Disposition  06/25/2016 [phone]  14:00- Spoke with pt's son Radene OuDonald Hermanowski at number above. Provided him with contact information for [facility] to follow up once transferred.

## 2016-06-25 NOTE — ED Provider Notes (Signed)
[facility].  The pt continues to be manic.  She was d/c FNP [place] who recommended starting zypresz IR 5 mg q8h prn agitation and agrees with the inpatient treatment.   Pt is stable for transfer.   Jacalyn LefevreJulie Sicilia Killough, MD 06/25/16 1256

## 2016-06-26 DIAGNOSIS — J4520 Mild intermittent asthma, uncomplicated: Secondary | ICD-10-CM | POA: Insufficient documentation

## 2016-06-26 DIAGNOSIS — F312 Bipolar disorder, current episode manic severe with psychotic features: Secondary | ICD-10-CM | POA: Insufficient documentation

## 2016-06-26 DIAGNOSIS — Z8673 Personal history of transient ischemic attack (TIA), and cerebral infarction without residual deficits: Secondary | ICD-10-CM | POA: Insufficient documentation

## 2016-06-26 DIAGNOSIS — R32 Unspecified urinary incontinence: Secondary | ICD-10-CM | POA: Insufficient documentation

## 2016-06-30 DIAGNOSIS — R159 Full incontinence of feces: Secondary | ICD-10-CM | POA: Insufficient documentation

## 2016-06-30 DIAGNOSIS — T8142XA Infection following a procedure, deep incisional surgical site, initial encounter: Secondary | ICD-10-CM | POA: Insufficient documentation

## 2016-07-04 ENCOUNTER — Telehealth: Payer: Self-pay | Admitting: Family Medicine

## 2016-07-04 NOTE — Telephone Encounter (Signed)
Patient left vm requesting a 1-2 day follow up from the hospital. When I called back the son and patient states again that she needs a 1-2 day follow up from hospital. He couldn't remember the name of the hospital.  I didn't have anywhere to put her on Dr. Deirdre Peerurham's schedule therefore I used MB's appt on Monday. The son states he will bring in all of her medical information. He states that she has a cyst under a stimulator/pacemaker and is in severe pain.   CB# [phone]

## 2016-07-07 ENCOUNTER — Ambulatory Visit (INDEPENDENT_AMBULATORY_CARE_PROVIDER_SITE_OTHER): Payer: Medicare Other | Admitting: Physician Assistant

## 2016-07-07 ENCOUNTER — Encounter: Payer: Self-pay | Admitting: Physician Assistant

## 2016-07-07 VITALS — BP 110/78 | HR 56 | Temp 97.9°F | Resp 14 | Wt 158.0 lb

## 2016-07-07 DIAGNOSIS — Z09 Encounter for follow-up examination after completed treatment for conditions other than malignant neoplasm: Secondary | ICD-10-CM

## 2016-07-07 NOTE — Progress Notes (Signed)
Patient ID: Lydia Morgan MRN: [id number], DOB: [birth year], 65 y.o. Date of Encounter: 07/07/2016, 10:02 AM    Chief Complaint:  Chief Complaint  Patient presents with  . Hospital F/U     HPI: 65 y.o. year old female here for OV to f/u after recent hospitalization.   She has been accepted by Dr. Jeanice Lim as a new patient---She is to establish care with Dr. Jeanice Lim.  However, there is a phone message documented on 07/04/16 which documents that patient called stating that she needed a follow-up office visit within 2-3 days of her hospital discharge.  Dr Jeanice Lim did not have an opening available so she was put on my schedule for this visit and then will go to Dr. Jeanice Lim to establish care as her PCP.  Today I reviewed the hospital records that are available in Epic.  I Asked patient and son whether there was anything in particular that they were wanting/needing to address at today's visit.  They state that the only thing that needed to be checked here today--- is her back----says that she "has a stimulator for her bowels and that there is a cyst underneath the stimulator."  Says that she has an appointment to see a specialist about the stimulator machine at [facility] on November 6.  Says that she was recently told that there was a cyst underneath that stimulator and that it was infected and has taken antibiotics and completed them.  No other complaints or concerns today.     Home Meds:   Outpatient Medications Prior to Visit  Medication Sig Dispense Refill  . calcium-vitamin D (OSCAL WITH D) 500-200 MG-UNIT tablet Take 1 tablet by mouth every morning.    Marland Kitchen levothyroxine (SYNTHROID, LEVOTHROID) 137 MCG tablet Take 137 mcg by mouth daily before breakfast.    . lithium carbonate (ESKALITH) 450 MG CR tablet Take 450-675 mg by mouth 2 (two) times daily. 450mg  in the morning and 675mg  in the evening.    . Multiple Vitamin (MULTIVITAMIN WITH MINERALS) TABS tablet Take 1 tablet by mouth  daily.    . traZODone (DESYREL) 100 MG tablet Take 200 mg by mouth at bedtime.     No facility-administered medications prior to visit.     Allergies:  Allergies  Allergen Reactions  . Ambien [Zolpidem Tartrate] Other (See Comments)    Hallucinations  . Ativan [Lorazepam] Other (See Comments)    hallucinations      Review of Systems: See HPI for pertinent ROS. All other ROS negative.    Physical Exam: Blood pressure 110/78, pulse (!) 56, temperature 97.9 F (36.6 C), temperature source Oral, resp. rate 14, weight 158 lb (71.7 kg)., Body mass index is 24.75 kg/m. General: WNWD WF. Excoriations/scabs on face.  Appears in no acute distress. Neck: Supple. No thyromegaly. No lymphadenopathy. Lungs: Clear bilaterally to auscultation without wheezes, rales, or rhonchi. Breathing is unlabored. Heart: Regular rhythm. No murmurs, rubs, or gallops. Msk:  Strength and tone normal for age. Skin: I asked where this stimulator is--she points to right low back---there is a healing wound there---there is no active erythema and there is no purulent drainage.  Neuro: Alert and oriented X 3. Moves all extremities spontaneously. Gait is normal. CNII-XII grossly in tact. Psych:  Responds to questions appropriately with a normal affect.     ASSESSMENT AND PLAN:  65 y.o. year old female with  1. Hospital discharge follow-up Reviewed all of the records available in Epic including x-ray and CT reports and I  see no mention of cyst or infection at this stimulator site. There is no sign of active infection on exam at this time. Pt states that she has completed the course of antibiotics. Reassured her that this is stable and does not need further treatment at this time. She is scheduled appointment here with Dr. Jeanice Limurham in 1 week to Establish Care with her is her PCP. Follow-up sooner if needed.   Signed, [address][name], [place], [facility] 07/07/2016 10:02 AM

## 2016-07-07 NOTE — Telephone Encounter (Signed)
Pt seen for OV with me today for hospital f/u.

## 2016-07-16 ENCOUNTER — Ambulatory Visit: Payer: Medicare Other | Admitting: Family Medicine

## 2016-08-12 ENCOUNTER — Ambulatory Visit: Payer: Medicare Other | Admitting: Family Medicine

## 2016-08-13 ENCOUNTER — Ambulatory Visit (INDEPENDENT_AMBULATORY_CARE_PROVIDER_SITE_OTHER): Payer: Medicare Other | Admitting: Physician Assistant

## 2016-08-13 ENCOUNTER — Encounter: Payer: Self-pay | Admitting: Physician Assistant

## 2016-08-13 VITALS — BP 110/64 | HR 60 | Temp 98.0°F | Resp 16 | Ht 67.0 in | Wt 149.0 lb

## 2016-08-13 DIAGNOSIS — E039 Hypothyroidism, unspecified: Secondary | ICD-10-CM

## 2016-08-13 DIAGNOSIS — H6500 Acute serous otitis media, unspecified ear: Secondary | ICD-10-CM | POA: Diagnosis not present

## 2016-08-13 DIAGNOSIS — F3112 Bipolar disorder, current episode manic without psychotic features, moderate: Secondary | ICD-10-CM

## 2016-08-13 DIAGNOSIS — M5432 Sciatica, left side: Secondary | ICD-10-CM

## 2016-08-13 LAB — TSH: TSH: 0.03 m[IU]/L — AB

## 2016-08-13 MED ORDER — AMOXICILLIN-POT CLAVULANATE 875-125 MG PO TABS: 1.0000 | ORAL_TABLET | Freq: Two times a day (BID) | ORAL | 0 refills | Status: AC

## 2016-08-13 MED ORDER — KETOROLAC TROMETHAMINE 60 MG/2ML IM SOLN
60.0000 mg | Freq: Once | INTRAMUSCULAR | Status: AC
  Administered 2016-08-13: 60 mg via INTRAMUSCULAR

## 2016-08-13 NOTE — Progress Notes (Signed)
Patient ID: Lydia Morgan MRN: [id number], DOB: May 20, 1951, 65 y.o. Date of Encounter: @DATE @  Chief Complaint:  Chief Complaint  Patient presents with  . Otitis Media    right ear  . pain in leg    left leg    HPI: 65 y.o. year old female  presents with above.   When I entered the exam room, she appears upset. I asked if she was ok---says "It's because I was left in his room all alone". Says that she hasn't taken her pills yet today and if she doesn't take her bipolar medicines she gets like this. I asked if there was anything I could do and she says that she "will be okay now that I am in here and that the only thing that will really help is for her to just hurry up and get done here so she can leave."  She says that someone who works with her insurance company came to her house and told her that she needed to come here for treatment for her ears. She has not noticed any pain in her ears.  She also sits and rubs her hand up and down the posterior aspect of her left thigh and says that she has pain starting up towards the left buttock that is running down the back and lateral side of her leg. Says that her toes even feel tingly. Says that this has been bothering her for several weeks. Later when I go to do her exam she shows me scars in her low back from prior surgeries. She also notes during the visit "I had a sciatic nerve problem once. "  She also says that she needs to have blood work done to check her thyroid now that she is going to be coming to this office. (Reviewed my last OV note----she was accepted here as a new patient by DR. Pickett. She needs to be scheduled to see Dr. Jeanice Lim for her next office visit and get established with her as her PCP)  No other specific complaints or concerns today.   Past Medical History:  Diagnosis Date  . Allergy   . Anemia   . Anxiety   . Arthritis   . Asthma   . Bipolar 1 disorder (H[facility])   . Blood transfusion without reported  diagnosis   . Depression   . Glaucoma   . Sleep apnea   . Stroke (HCC)   . Thyroid disease   . Ulcer (H[facility])      Home Meds: Outpatient Medications Prior to Visit  Medication Sig Dispense Refill  . calcium-vitamin D (OSCAL WITH D) 500-200 MG-UNIT tablet Take 1 tablet by mouth every morning.    . clindamycin (CLEOCIN) 150 MG capsule Take by mouth 3 (three) times daily.    Marland Kitchen levothyroxine (SYNTHROID, LEVOTHROID) 137 MCG tablet Take 137 mcg by mouth daily before breakfast.    . lithium carbonate (ESKALITH) 450 MG CR tablet Take 450-675 mg by mouth 2 (two) times daily. 450mg  in the morning and 675mg  in the evening.    . Multiple Vitamin (MULTIVITAMIN WITH MINERALS) TABS tablet Take 1 tablet by mouth daily.    . traZODone (DESYREL) 100 MG tablet Take 200 mg by mouth at bedtime.    . Acidophilus Lactobacillus CAPS Take by mouth.    . QUEtiapine (SEROQUEL) 50 MG tablet Take 50 mg by mouth at bedtime.     No facility-administered medications prior to visit.     Allergies:  Allergies  Allergen Reactions  . Ambien [Zolpidem Tartrate] Other (See Comments)    Hallucinations  . Ativan [Lorazepam] Other (See Comments)    hallucinations    Social History   Social History  . Marital status: Legally Separated    Spouse name: N/A  . Number of children: N/A  . Years of education: N/A   Occupational History  . Not on file.   Social History Main Topics  . Smoking status: Never Smoker  . Smokeless tobacco: Never Used  . Alcohol use No  . Drug use: No  . Sexual activity: No   Other Topics Concern  . Not on file   Social History Narrative  . No narrative on file    Family History  Problem Relation Age of Onset  . Cancer Mother   . Depression Mother   . Diabetes Mother   . Heart disease Mother   . Vision loss Mother   . Diabetes Father   . Arthritis Sister   . Diabetes Sister   . Learning disabilities Sister   . Diabetes Brother   . Learning disabilities Brother   . Asthma  Daughter   . Diabetes Daughter   . Diabetes Son   . Diabetes Maternal Aunt   . Diabetes Maternal Uncle   . Diabetes Paternal Aunt   . Cancer Maternal Grandmother   . Diabetes Maternal Grandmother   . Diabetes Maternal Grandfather      Review of Systems:  See HPI for pertinent ROS. All other ROS negative.    Physical Exam: Blood pressure 110/64, pulse 60, temperature 98 F (36.7 C), temperature source Oral, resp. rate 16, height 5\' 7"  (1.702 m), weight 149 lb (67.6 kg), SpO2 98 %., Body mass index is 23.34 kg/m. General: WNWD WF Appears in no acute distress. Head: Normocephalic, atraumatic, eyes without discharge, sclera non-icteric, nares are without discharge. Right ear canal with some cerumen present but can visualize TM. Right TM with effusion. Right ear canal patent, normal. Left TM with effusion.  Oral cavity moist, posterior pharynx without exudate, erythema, peritonsillar abscess, or post nasal drip.  Neck: Supple. No thyromegaly. No lymphadenopathy. Lungs: Clear bilaterally to auscultation without wheezes, rales, or rhonchi. Breathing is unlabored. Heart: RRR with S1 S2. No murmurs, rubs, or gallops. Musculoskeletal:  Strength and tone normal for age. Scar present in the right low back. She has some tenderness with palpation of the left sciatic notch. Extremities/Skin: Warm and dry.  Neuro: Alert and oriented X 3. Moves all extremities spontaneously. Gait is normal. CNII-XII grossly in tact. Psych:  When I entered the exam room, she appears upset. I asked if she was ok---says "It's because I was left in his room all alone". Says that she hasn't taken her pills yet today and if she doesn't take her bipolar medicines she gets like this. I asked if there was anything I could do and she says that she "will be okay now that I am in here and that the only thing that will really help is for her to just hurry up and get done here so she can leave."      ASSESSMENT AND PLAN:  65 y.o.  year old female with  1. Hypothyroidism, unspecified type Patient notes at the end of the visit that she did just recently see psychiatry and they just recently checked her lithium level. Will check her TSH level now and follow-up accordingly. - TSH (Reviewed my last OV note----she was accepted here as a new patient by  DR. Jeanice LimURHAM. She needs to be scheduled to see Dr. Jeanice Limurham for her next office visit and get established with her as her PCP)   2. Acute serous otitis media, recurrence not specified, unspecified laterality Have her take Augmentin. She may need follow-up with ENT. Asked if she has ever seen ENT and she said she did last year once because of a perforated eardrum but that was the only time she had seen ENT - amoxicillin-clavulanate (AUGMENTIN) 875-125 MG tablet; Take 1 tablet by mouth 2 (two) times daily.  Dispense: 20 tablet; Refill: 0  3. Left sided sciatica Given her bipolar medications I am concerned of what medications to give to treat her sciatica.  I considered prednisone but I was concerned about how this may affect her bipolar -- but on top of that she got all upset about the idea of prednisone because it makes her gain weight and she absolutely does not want to gain weight. I am concerned of using muscle relaxers or Lyrica or gabapentin with her psych meds She says that in the past she has gotten a injection and she would like to just get an injection of something to help calm down the pain right now and then she will be fine.  - ketorolac (TORADOL) injection 60 mg; Inject 2 mLs (60 mg total) into the muscle once.  4. Bipolar 1 disorder, manic, moderate (HCC) This is managed by psychiatry.   Signed, [address] AleknagikDixon, [place], B[facility]M 08/13/2016 1:25 PM

## 2016-08-14 ENCOUNTER — Other Ambulatory Visit: Payer: Self-pay | Admitting: Family Medicine

## 2016-08-14 ENCOUNTER — Telehealth: Payer: Self-pay

## 2016-08-14 MED ORDER — LEVOTHYROXINE SODIUM 137 MCG PO TABS: 137.0000 ug | ORAL_TABLET | Freq: Every day | ORAL | 1 refills | Status: DC

## 2016-08-14 MED ORDER — LITHIUM CARBONATE ER 450 MG PO TBCR: 450.0000 mg | EXTENDED_RELEASE_TABLET | Freq: Two times a day (BID) | ORAL | 1 refills | Status: DC

## 2016-08-14 MED ORDER — ESTRADIOL 0.025 MG/24HR TD PTTW: 1.0000 | MEDICATED_PATCH | TRANSDERMAL | 1 refills | Status: DC

## 2016-08-14 MED ORDER — TRAZODONE HCL 100 MG PO TABS: 200.0000 mg | ORAL_TABLET | Freq: Every day | ORAL | 1 refills | Status: DC

## 2016-08-14 MED ORDER — HYDROCODONE-ACETAMINOPHEN 5-325 MG PO TABS: 1.0000 | ORAL_TABLET | Freq: Four times a day (QID) | ORAL | 0 refills | Status: AC | PRN

## 2016-08-14 MED ORDER — QUETIAPINE FUMARATE 50 MG PO TABS: 50.0000 mg | ORAL_TABLET | Freq: Every day | ORAL | 1 refills | Status: DC

## 2016-08-14 NOTE — Telephone Encounter (Signed)
Rx filled pt can picked up after 2pm on 11-10

## 2016-08-14 NOTE — Telephone Encounter (Signed)
At office visit she absolutely refused prednisone. She said that in the past Toradol injection relieved her pain. Therefore did the Toradol injection. Can not use tramadol with her psychiatry medications. Can NOT use Narcotic meds long term. Tell her she have to come here and pick up hard copy prescription and can only use the hydrocodone for short-term. Can print prescription for hydrocodone 5/325 one by mouth every 6 hours prn pain # 20+0.

## 2016-08-14 NOTE — Telephone Encounter (Signed)
Medication refilled per protocol. 

## 2016-08-14 NOTE — Telephone Encounter (Signed)
Pt states the Toradol worked for a little while, but pain has started back up and is req something for pain

## 2016-08-15 DIAGNOSIS — R197 Diarrhea, unspecified: Secondary | ICD-10-CM | POA: Insufficient documentation

## 2016-08-15 DIAGNOSIS — R159 Full incontinence of feces: Secondary | ICD-10-CM | POA: Insufficient documentation

## 2016-08-15 DIAGNOSIS — R32 Unspecified urinary incontinence: Secondary | ICD-10-CM | POA: Insufficient documentation

## 2016-08-21 ENCOUNTER — Other Ambulatory Visit: Payer: Self-pay | Admitting: *Deleted

## 2016-08-21 MED ORDER — LEVOTHYROXINE SODIUM 137 MCG PO TABS: 137.0000 ug | ORAL_TABLET | Freq: Every day | ORAL | 1 refills | Status: DC

## 2016-08-21 MED ORDER — TRAZODONE HCL 100 MG PO TABS: 200.0000 mg | ORAL_TABLET | Freq: Every day | ORAL | 1 refills | Status: AC

## 2016-08-21 MED ORDER — ESTRADIOL 0.025 MG/24HR TD PTTW: 1.0000 | MEDICATED_PATCH | TRANSDERMAL | 1 refills | Status: DC

## 2016-08-21 MED ORDER — LITHIUM CARBONATE ER 450 MG PO TBCR: 450.0000 mg | EXTENDED_RELEASE_TABLET | Freq: Two times a day (BID) | ORAL | 1 refills | Status: AC

## 2016-08-21 MED ORDER — QUETIAPINE FUMARATE 50 MG PO TABS: 50.0000 mg | ORAL_TABLET | Freq: Every day | ORAL | 1 refills | Status: AC

## 2016-08-21 NOTE — Telephone Encounter (Signed)
Received fax requesting refill on routine meds.   Refill appropriate and filled per protocol. 

## 2016-08-26 ENCOUNTER — Telehealth: Payer: Self-pay | Admitting: Family Medicine

## 2016-08-26 MED ORDER — LEVOTHYROXINE SODIUM 137 MCG PO TABS: 137.0000 ug | ORAL_TABLET | Freq: Every day | ORAL | 1 refills | Status: AC

## 2016-08-26 NOTE — Telephone Encounter (Signed)
Prescription sent to pharmacy.

## 2016-08-26 NOTE — Telephone Encounter (Signed)
Patient is calling to get refill on her levothyroxine sent to [facility] if possible  [phone]

## 2016-08-27 ENCOUNTER — Telehealth: Payer: Self-pay | Admitting: Family Medicine

## 2016-08-27 NOTE — Telephone Encounter (Signed)
Levothyroxine refill from yesterday sent to wrong pharmacy ([facility])  Pt wanted sent to [facility].  Order called into [facility].

## 2016-09-15 ENCOUNTER — Ambulatory Visit (INDEPENDENT_AMBULATORY_CARE_PROVIDER_SITE_OTHER): Payer: Medicare Other

## 2016-09-15 ENCOUNTER — Ambulatory Visit (INDEPENDENT_AMBULATORY_CARE_PROVIDER_SITE_OTHER): Payer: Medicare Other | Admitting: Podiatry

## 2016-09-15 ENCOUNTER — Encounter: Payer: Self-pay | Admitting: Podiatry

## 2016-09-15 DIAGNOSIS — M79671 Pain in right foot: Secondary | ICD-10-CM

## 2016-09-15 DIAGNOSIS — M79672 Pain in left foot: Secondary | ICD-10-CM

## 2016-09-15 DIAGNOSIS — L84 Corns and callosities: Secondary | ICD-10-CM

## 2016-09-15 DIAGNOSIS — I739 Peripheral vascular disease, unspecified: Secondary | ICD-10-CM | POA: Diagnosis not present

## 2016-09-15 NOTE — Progress Notes (Signed)
Subjective:     Patient ID: Lydia NajjarBessie Morgan, female   DOB: 11/12/1950, 65 y.o.   MRN: [id number]  HPI 65 year old female presents to the office today for concerns of calluses to both of her feet. She has been seeing a podiatrist every 2 months to get them trimmed. She also has orthotics but they are uncomfortable and she does not like to wear them.   Upon talking to her she does admit to leg pain which has been ongoing for some time. She also has been having ongoing numbness or tingling. These issues are not new and have been ongoing for some time.   Review of Systems  All other systems reviewed and are negative.      Objective:   Physical Exam General: AAO x3, NAD  Dermatological: Hyperkeratotic lesions medial hallux as well as fifth metatarsal base plantarly bilaterally. Upon debridement no underlying ulceration, drainage or any signs of infection. There is no other open lesions or pre-ulcerative lesions identified today.  Vascular: DP pulses 2/4, PT pulse 1/4, CRT less than 3 seconds. There is no swelling to the feet and there is no pain with calf compression, swelling, warmth, erythema.  Neruologic: Grossly intact via light touch bilateral. Vibratory intact via tuning fork bilateral. Protective threshold with Semmes Wienstein monofilament intact to all pedal sites bilateral.   Musculoskeletal: No gross boney pedal deformities bilateral. No pain, crepitus, or limitation noted with foot and ankle range of motion bilateral. Muscular strength 5/5 in all groups tested bilateral.  Gait: Unassisted, Nonantalgic.      Assessment:     Symptomatic hyperkerotic lesions; leg pain     Plan:     -Treatment options discussed including all alternatives, risks, and complications -Etiology of symptoms were discussed -X-rays were obtained and reviewed with the patient. No evidence of acute fracture.  Vessel calcification is present on the lateral views.  -Hyperkerotic lesions debrided x 4  without complications or bleeding -Discussed orthotics to help offload the painful calluses. She has them at home but she does not wear them as they are uncomfortable. Recommended her to bring them to the next appointment to see if I can modify them for her.  -Will order arterial studies given xray vessel calcification; leg cramping. This is chronic at this point. -Follow-up in 9 weeks or sooner if any problems arise. In the meantime, encouraged to call the office with any questions, concerns, change in symptoms.   Ovid CurdMatthew Heather Streeper, DPM

## 2016-10-02 ENCOUNTER — Other Ambulatory Visit: Payer: Self-pay | Admitting: Podiatry

## 2016-10-02 DIAGNOSIS — IMO0002 Reserved for concepts with insufficient information to code with codable children: Secondary | ICD-10-CM

## 2016-10-03 ENCOUNTER — Ambulatory Visit (HOSPITAL_COMMUNITY)
Admission: EM | Admit: 2016-10-03 | Discharge: 2016-10-03 | Disposition: A | Discharge status: Home / Self Care | Payer: Medicare Other | Attending: Family Medicine | Admitting: Family Medicine

## 2016-10-03 ENCOUNTER — Encounter (HOSPITAL_COMMUNITY): Payer: Self-pay | Admitting: Emergency Medicine

## 2016-10-03 DIAGNOSIS — M545 Low back pain, unspecified: Secondary | ICD-10-CM

## 2016-10-03 DIAGNOSIS — H6691 Otitis media, unspecified, right ear: Secondary | ICD-10-CM

## 2016-10-03 MED ORDER — AMOXICILLIN 500 MG PO CAPS: 500.0000 mg | ORAL_CAPSULE | Freq: Three times a day (TID) | ORAL | 0 refills | Status: AC

## 2016-10-03 MED ORDER — TRAMADOL HCL 50 MG PO TABS: 50.0000 mg | ORAL_TABLET | Freq: Four times a day (QID) | ORAL | 0 refills | Status: AC | PRN

## 2016-10-03 NOTE — ED Triage Notes (Signed)
Pt was given cortisone shots for her lower back a year ago.  Two weeks ago she states she started having pain again.  Pt also reports bilateral ear pain.  She reports being hit in the head on the left side with a basketball and that is when her pain in her left ear began.  She states the pain has since moved to her right ear as well.  Pt denies any fever.

## 2016-10-03 NOTE — ED Provider Notes (Signed)
CSN: [id number]     Arrival date & time 10/03/16  1113 History   None    Chief Complaint  Patient presents with  . Otitis Media    bilateral  . Back Pain    lower back   (Consider location/radiation/quality/duration/timing/severity/associated sxs/prior Treatment) The history is provided by the patient. No language interpreter was used.  Back Pain  Location:  Generalized Quality:  Aching Pain severity:  Moderate Pain is:  Worse during the day Onset quality:  Gradual Timing:  Constant Progression:  Worsening Chronicity:  New Worsened by:  Nothing Ineffective treatments:  None tried Associated symptoms: no fever   Pt complains of pain in the right ear  Past Medical History:  Diagnosis Date  . Allergy   . Anemia   . Anxiety   . Arthritis   . Asthma   . Bipolar 1 disorder (HCC)   . Blood transfusion without reported diagnosis   . Depression   . Glaucoma   . Sleep apnea   . Stroke (HCC)   . Thyroid disease   . Ulcer [facility](HCC)    Past Surgical History:  Procedure Laterality Date  . ABDOMINAL HYSTERECTOMY    . CHOLECYSTECTOMY    . SMALL INTESTINE SURGERY    . TUBAL LIGATION     Family History  Problem Relation Age of Onset  . Cancer Mother   . Depression Mother   . Diabetes Mother   . Heart disease Mother   . Vision loss Mother   . Diabetes Father   . Arthritis Sister   . Diabetes Sister   . Learning disabilities Sister   . Diabetes Brother   . Learning disabilities Brother   . Asthma Daughter   . Diabetes Daughter   . Diabetes Son   . Diabetes Maternal Aunt   . Diabetes Maternal Uncle   . Diabetes Paternal Aunt   . Cancer Maternal Grandmother   . Diabetes Maternal Grandmother   . Diabetes Maternal Grandfather    Social History  Substance Use Topics  . Smoking status: Never Smoker  . Smokeless tobacco: Never Used  . Alcohol use No   OB History    No data available     Review of Systems  Constitutional: Negative for fever.  Musculoskeletal:  Positive for back pain.  All other systems reviewed and are negative.   Allergies  Ativan [lorazepam]; Other; Ambien [zolpidem tartrate]; Quetiapine; and Eszopiclone  Home Medications   Prior to Admission medications   Medication Sig Start Date End Date Taking? Authorizing Provider  estradiol (VIVELLE-DOT) 0.025 MG/24HR Place 1 patch onto the skin 2 (two) times a week. 08/21/16  Yes Salley ScarletKawanta F [place], MD  levothyroxine (SYNTHROID, LEVOTHROID) 137 MCG tablet Take 1 tablet (137 mcg total) by mouth daily before breakfast. 08/26/16  Yes Salley ScarletKawanta F [place], MD  lithium carbonate (ESKALITH) 450 MG CR tablet Take 1-1.5 tablets (450-675 mg total) by mouth 2 (two) times daily. 450mg  in the morning and 675mg  in the evening. 08/21/16  Yes Salley ScarletKawanta F [place], MD  Multiple Vitamin (MULTIVITAMIN WITH MINERALS) TABS tablet Take 1 tablet by mouth daily.   Yes Historical Provider, MD  traZODone (DESYREL) 100 MG tablet Take 2 tablets (200 mg total) by mouth at bedtime. 08/21/16  Yes Salley ScarletKawanta F Ignacio, MD  venlafaxine XR (EFFEXOR-XR) 150 MG 24 hr capsule Take 150 mg by mouth daily with breakfast.   Yes Historical Provider, MD  Acidophilus Lactobacillus CAPS Take by mouth.    Historical Provider, MD  amoxicillin (AMOXIL) 500 MG capsule Take 1 capsule (500 mg total) by mouth 3 (three) times daily. 10/03/16   Elson AreasLeslie K Carmin Dibartolo, PA-C  amoxicillin-clavulanate (AUGMENTIN) 875-125 MG tablet Take 1 tablet by mouth 2 (two) times daily. 08/13/16   Patriciaann ClanMary B Dixon, PA-C  calcium-vitamin D (OSCAL WITH D) 500-200 MG-UNIT tablet Take 1 tablet by mouth every morning.    Historical Provider, MD  clindamycin (CLEOCIN) 150 MG capsule Take by mouth 3 (three) times daily.    Historical Provider, MD  HYDROcodone-acetaminophen (NORCO/VICODIN) 5-325 MG tablet Take 1 tablet by mouth every 6 (six) hours as needed for moderate pain. 08/14/16   Patriciaann ClanMary B Dixon, PA-C  QUEtiapine (SEROQUEL) 50 MG tablet Take 1 tablet (50 mg total) by mouth at bedtime.  08/21/16   Salley ScarletKawanta F [place], MD  traMADol (ULTRAM) 50 MG tablet Take 1 tablet (50 mg total) by mouth every 6 (six) hours as needed. 10/03/16   Elson AreasLeslie K Yarah Fuente, PA-C   Meds Ordered and Administered this Visit  Medications - No data to display  BP 113/64 (BP Location: Left Arm)   Pulse 63   Temp 98 F (36.7 C) (Oral)   SpO2 100%  No data found.   Physical Exam  Constitutional: She appears well-developed and well-nourished. No distress.  HENT:  Head: Normocephalic and atraumatic.  Right tm erythemataous  Eyes: Conjunctivae are normal.  Neck: Neck supple.  Cardiovascular: Normal rate and regular rhythm.   No murmur heard. Pulmonary/Chest: Effort normal and breath sounds normal. No respiratory distress.  Abdominal: Soft. There is no tenderness.  Musculoskeletal: She exhibits no edema.  Neurological: She is alert.  Skin: Skin is warm and dry.  Psychiatric: She has a normal mood and affect.  Nursing note and vitals reviewed.   Urgent Care Course   Clinical Course     Procedures (including critical care time)  Labs Review Labs Reviewed - No data to display  Imaging Review No results found.   Visual Acuity Review  Right Eye Distance:   Left Eye Distance:   Bilateral Distance:    Right Eye Near:   Left Eye Near:    Bilateral Near:         MDM   1. Acute low back pain without sciatica, unspecified back pain laterality   2. Right otitis media, unspecified otitis media type    Pt has chronic back pain.  She request rx for tramadol.   Meds ordered this encounter  Medications  . venlafaxine XR (EFFEXOR-XR) 150 MG 24 hr capsule    Sig: Take 150 mg by mouth daily with breakfast.  . amoxicillin (AMOXIL) 500 MG capsule    Sig: Take 1 capsule (500 mg total) by mouth 3 (three) times daily.    Dispense:  30 capsule    Refill:  0    Order Specific Question:   Supervising Provider    Answer:   Linna HoffKINDL, JAMES D [phone][[id number]]  . traMADol (ULTRAM) 50 MG tablet    Sig: Take 1  tablet (50 mg total) by mouth every 6 (six) hours as needed.    Dispense:  15 tablet    Refill:  0    Order Specific Question:   Supervising Provider    Answer:   Bradd CanaryKINDL, JAMES D [[id number]]      Lonia SkinnerLeslie K [place]ores[place], PA-C 10/03/16 [phone]

## 2016-10-10 ENCOUNTER — Inpatient Hospital Stay (HOSPITAL_COMMUNITY): Admission: RE | Admit: 2016-10-10 | Payer: Self-pay | Source: Ambulatory Visit

## 2016-11-13 ENCOUNTER — Ambulatory Visit: Payer: Medicare Other | Admitting: Physician Assistant

## 2016-11-17 ENCOUNTER — Ambulatory Visit: Payer: Medicare Other | Admitting: Podiatry

## 2016-11-18 ENCOUNTER — Ambulatory Visit: Payer: Medicare Other | Admitting: Family Medicine

## 2017-01-28 ENCOUNTER — Other Ambulatory Visit: Payer: Self-pay | Admitting: Family Medicine

## 2017-02-03 ENCOUNTER — Other Ambulatory Visit: Payer: Self-pay | Admitting: Family Medicine

## 2017-02-03 NOTE — Telephone Encounter (Signed)
Patient has not established care with PCP.   Ok to refill?

## 2017-02-04 NOTE — Telephone Encounter (Signed)
Denied.  I do not Rx this med. She needs to see Dr. Jeanice Lim to get this Rx.

## 2017-02-04 NOTE — Telephone Encounter (Signed)
Spoke with Darl Pikes. Pt was accepted as New Pt by Dr. Jeanice Lim but has yet to have OV with Dr Jeanice Lim. Has cancelled and No-Showed for Dr. Jeanice Lim. Has to schedule and come for OV with Dr. Jeanice Lim.

## 2017-03-03 ENCOUNTER — Encounter: Payer: Self-pay | Admitting: Gastroenterology

## 2017-04-23 IMAGING — CT CT ABD-PELV W/ CM
2 of 5 series · 15 of 46 positions shown, 17 images · IV contrast (Omni 300)
Comparison: None.

CLINICAL DATA: Abdominal pain and diarrhea

EXAM:
CT ABDOMEN AND PELVIS WITH CONTRAST
TECHNIQUE: Multidetector CT imaging of the abdomen and pelvis was performed
using the standard protocol following bolus administration of
intravenous contrast.
CONTRAST:  100ml A5O00K-700 IOPAMIDOL (A5O00K-700) INJECTION 61%

[Series 2: a/p w/ 5mm · axial · 0.70mm/px · z∈[+770,+1264]mm · 12 of 113 slices shown, 14 images]
[im 7/113  soft-tissue]
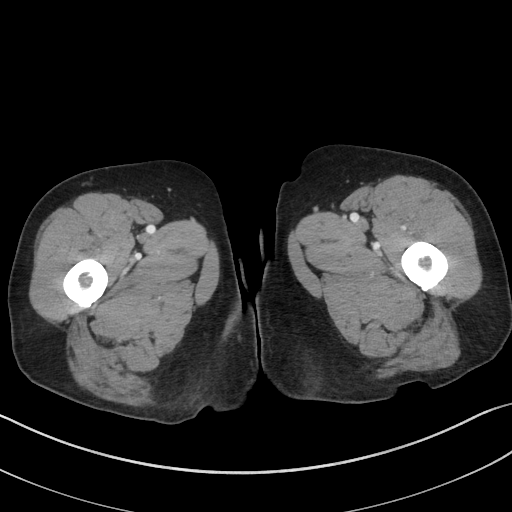
[im 7/113  bone]
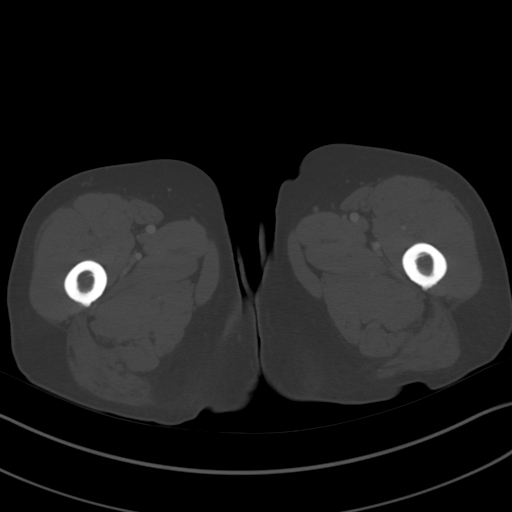
[im 19/113  soft-tissue]
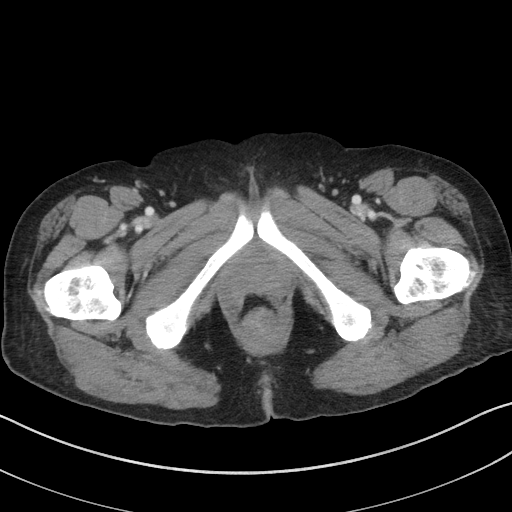
[im 25/113  soft-tissue]
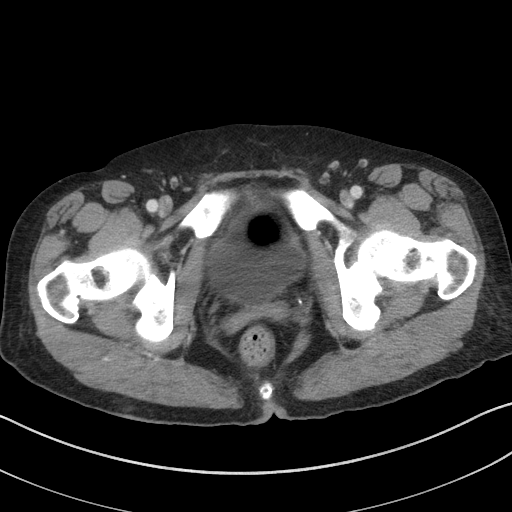
[im 32/113  soft-tissue]
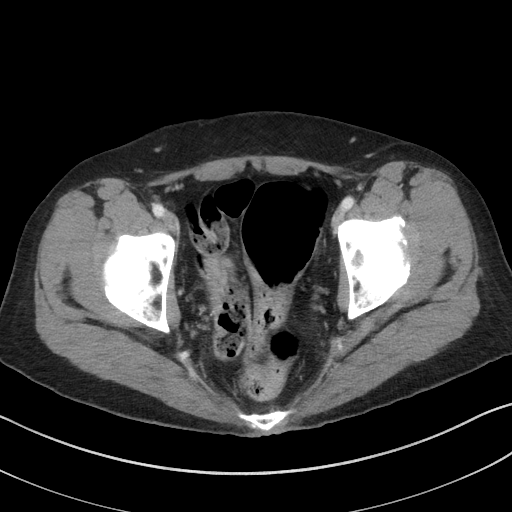
[im 44/113  soft-tissue]
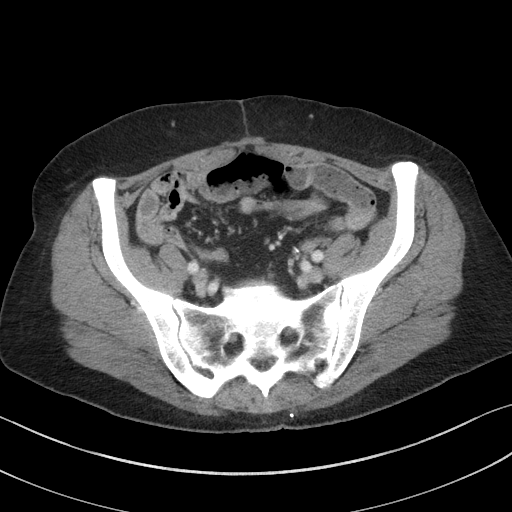
[im 50/113  soft-tissue]
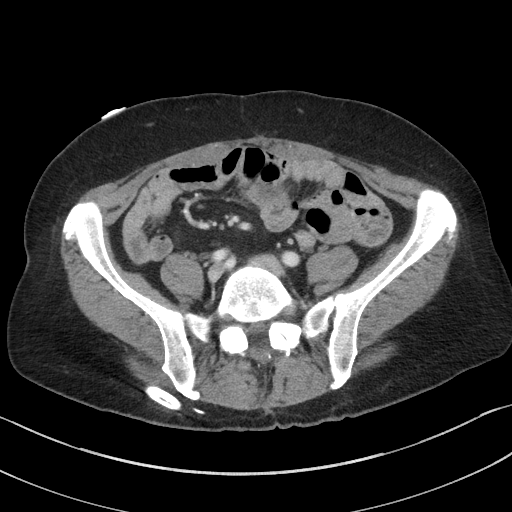
[im 63/113  soft-tissue]
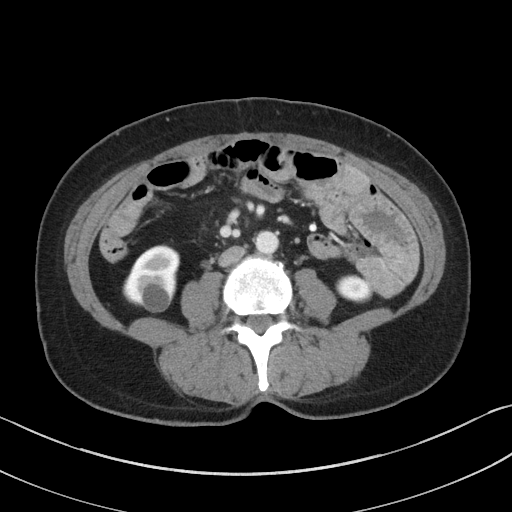
[im 69/113  soft-tissue]
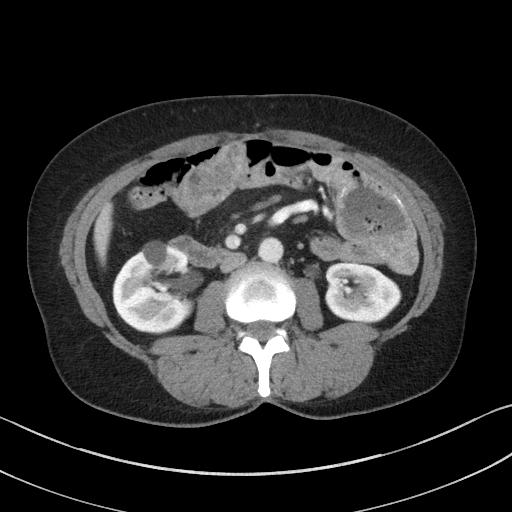
[im 81/113  soft-tissue]
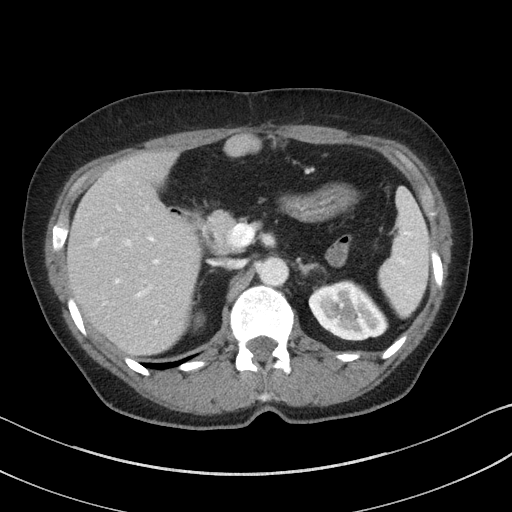
[im 81/113  bone]
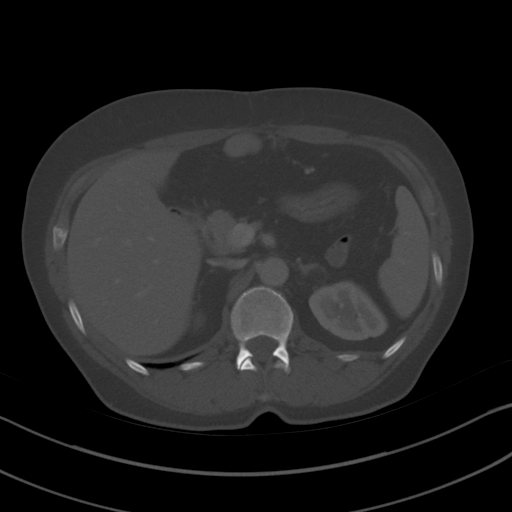
[im 88/113  soft-tissue]
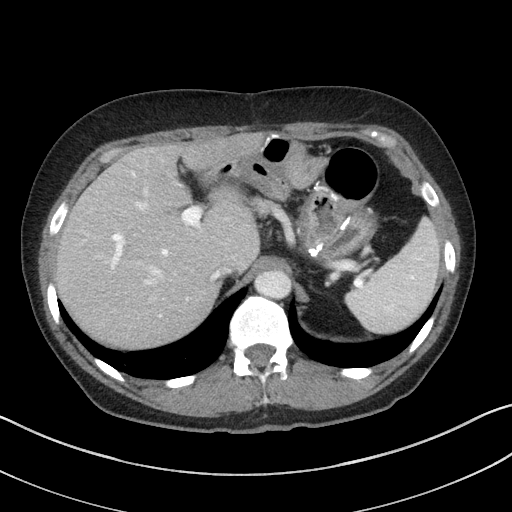
[im 94/113  soft-tissue]
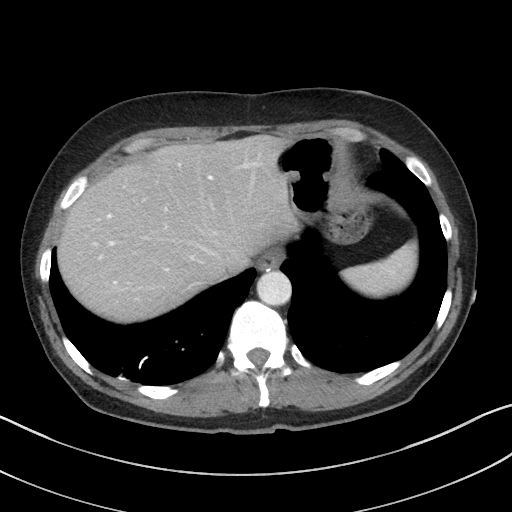
[im 106/113  soft-tissue]
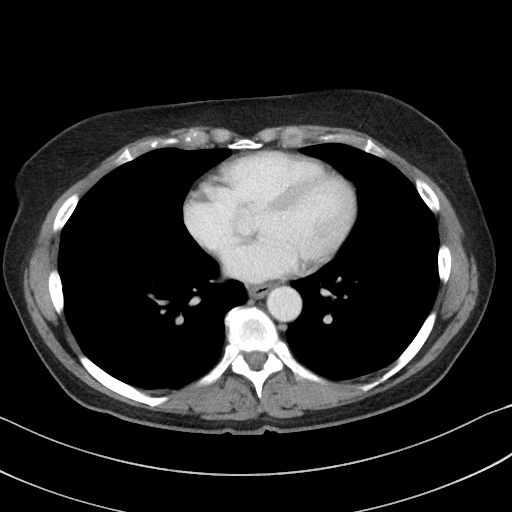

[Series 5: a/p w/ cor · coronal · 0.68mm/px · 3 of 119 slices shown]
[im 40/119  soft-tissue]
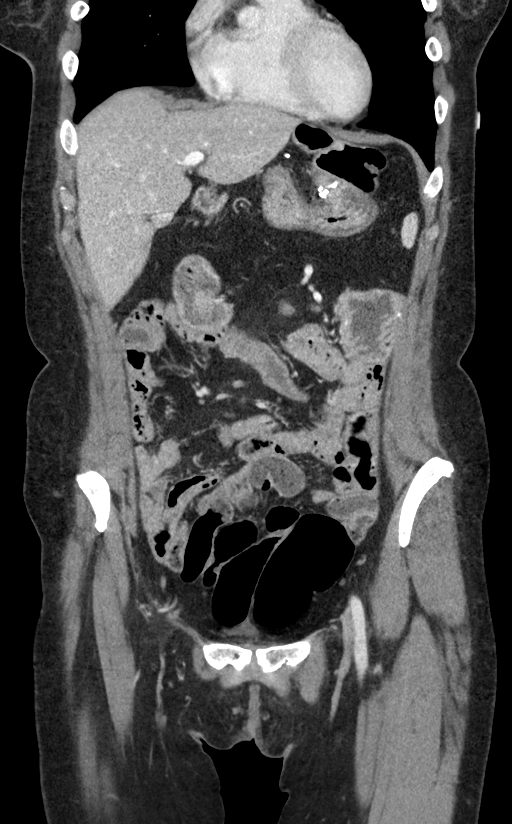
[im 53/119  soft-tissue]
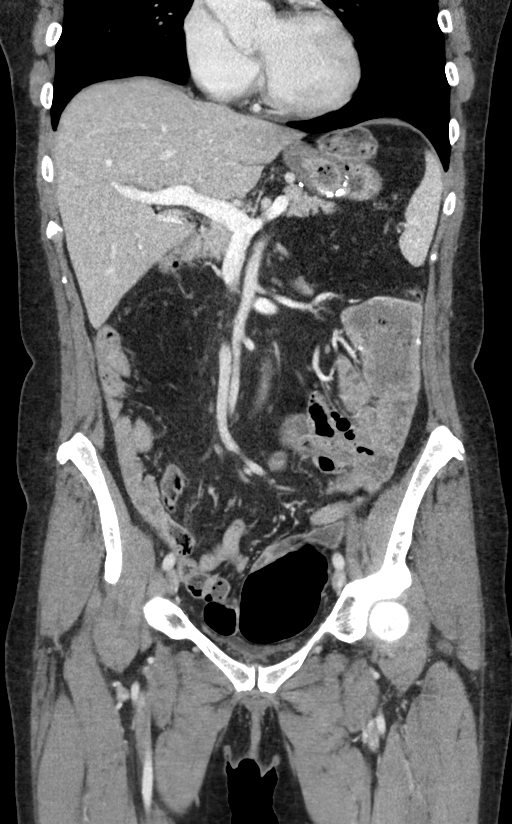
[im 66/119  soft-tissue]
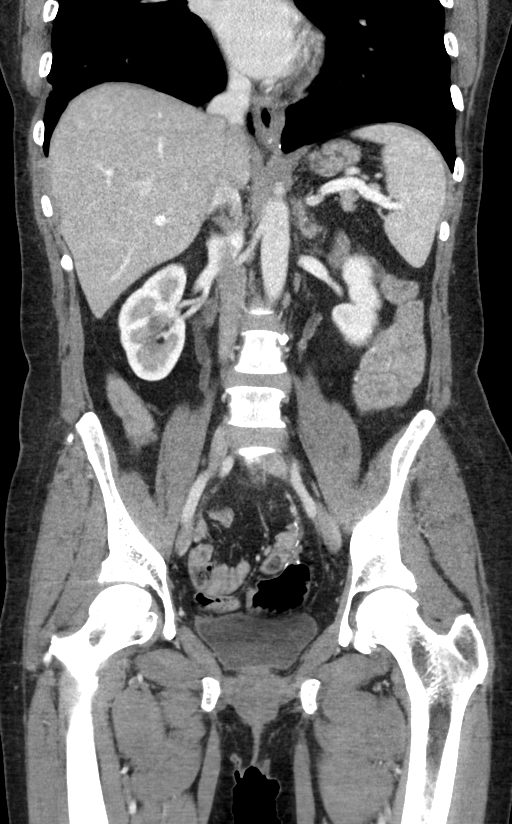

[15 of 46 positions shown; findings below may reference images not displayed]

FINDINGS: Lower chest: Multiple nodules are present in the right lung base.
These are subpleural in location and measure 3 - 6 mm in diameter.
Approximately 5 nodules in the right lung base are present. There is
also some linear scarring in the right posterior lung base with
associated calcification. Solitary 3 mm nodule left lower lobe
posteriorly. No pleural effusion. Heart size normal.

Hepatobiliary: Cholecystectomy. 1 cm round hypodensity left lobe
liver probably a cyst. 8 x 15 mm ill-defined hypodensity in the
inferior right lobe liver anteriorly adjacent to the liver capsule
is indeterminate. No other liver lesions identified.

Pancreas: Negative

Spleen: Negative

Adrenals/Urinary Tract: Normal renal size and contour. Right lower
pole cyst 15 mm. No mass or stone in the kidneys. Urinary bladder
normal.

Stomach/Bowel: Prior gastric surgery with surgical clips. Colectomy.
Ileo colic anastomosis appears to be at the level of the sigmoid
colon. Dilated loop of small bowel in the left mid abdomen near a
suture line. This measures 4 cm in diameter and could represent a
closed loop obstruction. This is relatively isolated and the
remainder of the small bowel is not dilated or thickened.

Vascular/Lymphatic: Negative

Reproductive: Hysterectomy.  No pelvic mass.

Other: No free fluid.  Negative for hernia.

Musculoskeletal: No acute skeletal abnormality.
IMPRESSION: Multiple lung nodules, under 7 mm in size. These could be due to
metastatic disease or chronic infection. CT of the chest with
contrast is recommended to evaluate for other nodules or lung mass.

8 x 15 mm ill-defined hypodensity in the right lower lip liver
anteriorly is indeterminate. Probable 1 cm cyst in the left lobe of
the liver.

Prior gastric surgery. Colectomy with ileocolic anastomosis in the
sigmoid colon. Focal short segment dilated small bowel loop near
anastomosis in the left abdomen could represent a closed loop bowel
obstruction.

## 2017-07-09 NOTE — Progress Notes (Signed)
Called patient. Scheduled npv with dr. Nedra Hai for 10/13/2017 at 10:30am on ac5. Patient confirmed.

## 2017-07-09 NOTE — Progress Notes (Signed)
Updated demographics in patient's chart.

## 2017-10-13 ENCOUNTER — Ambulatory Visit: Payer: Self-pay | Admitting: Student in an Organized Health Care Education/Training Program

## 2020-07-02 ENCOUNTER — Emergency Department (HOSPITAL_COMMUNITY)
Admission: EM | Admit: 2020-07-02 | Discharge: 2020-07-03 | Disposition: A | Discharge status: Home / Self Care | Payer: Medicare Other | Attending: Emergency Medicine | Admitting: Emergency Medicine

## 2020-07-02 ENCOUNTER — Encounter (HOSPITAL_COMMUNITY): Payer: Self-pay | Admitting: *Deleted

## 2020-07-02 ENCOUNTER — Other Ambulatory Visit: Payer: Self-pay

## 2020-07-02 DIAGNOSIS — Z5321 Procedure and treatment not carried out due to patient leaving prior to being seen by health care provider: Secondary | ICD-10-CM | POA: Diagnosis not present

## 2020-07-02 DIAGNOSIS — M545 Low back pain: Secondary | ICD-10-CM | POA: Insufficient documentation

## 2020-07-02 DIAGNOSIS — R1030 Lower abdominal pain, unspecified: Secondary | ICD-10-CM | POA: Diagnosis present

## 2020-07-02 LAB — CBC
HCT: 35.4 % — ABNORMAL LOW (ref 36.0–46.0)
Hemoglobin: 11.3 g/dL — ABNORMAL LOW (ref 12.0–15.0)
MCH: 29.7 pg (ref 26.0–34.0)
MCHC: 31.9 g/dL (ref 30.0–36.0)
MCV: 93.2 fL (ref 80.0–100.0)
Platelets: 237 10*3/uL (ref 150–400)
RBC: 3.8 MIL/uL — ABNORMAL LOW (ref 3.87–5.11)
RDW: 13.3 % (ref 11.5–15.5)
WBC: 7.6 10*3/uL (ref 4.0–10.5)
nRBC: 0 % (ref 0.0–0.2)

## 2020-07-02 LAB — COMPREHENSIVE METABOLIC PANEL
ALT: 33 U/L (ref 0–44)
AST: 35 U/L (ref 15–41)
Albumin: 3.9 g/dL (ref 3.5–5.0)
Alkaline Phosphatase: 99 U/L (ref 38–126)
Anion gap: 9 (ref 5–15)
BUN: 20 mg/dL (ref 8–23)
CO2: 18 mmol/L — ABNORMAL LOW (ref 22–32)
Calcium: 9.3 mg/dL (ref 8.9–10.3)
Chloride: 111 mmol/L (ref 98–111)
Creatinine, Ser: 1.38 mg/dL — ABNORMAL HIGH (ref 0.44–1.00)
GFR calc Af Amer: 45 mL/min — ABNORMAL LOW (ref >60)
GFR calc non Af Amer: 39 mL/min — ABNORMAL LOW (ref >60)
Glucose, Bld: 96 mg/dL (ref 70–99)
Potassium: 4 mmol/L (ref 3.5–5.1)
Sodium: 138 mmol/L (ref 135–145)
Total Bilirubin: 0.6 mg/dL (ref 0.3–1.2)
Total Protein: 6.9 g/dL (ref 6.5–8.1)

## 2020-07-02 LAB — LIPASE, BLOOD: Lipase: 48 U/L (ref 11–51)

## 2020-07-02 NOTE — ED Triage Notes (Signed)
Pt arrived by [facility] from home. Has colostomy bag and having abd pain x 3 days. Pain is lower abd that radiates to right lower back and has noted blood in colostomy. reports n/v.

## 2020-07-03 LAB — URINALYSIS, ROUTINE W REFLEX MICROSCOPIC
Bilirubin Urine: NEGATIVE
Glucose, UA: NEGATIVE mg/dL
Hgb urine dipstick: NEGATIVE
Ketones, ur: NEGATIVE mg/dL
Leukocytes,Ua: NEGATIVE
Nitrite: NEGATIVE
Protein, ur: NEGATIVE mg/dL
Specific Gravity, Urine: 1.006 (ref 1.005–1.030)
pH: 5 (ref 5.0–8.0)

## 2020-07-03 NOTE — ED Notes (Signed)
CALLED X 3 NO ANSWER

## 2020-07-03 NOTE — ED Notes (Addendum)
Pt said colostomy bag is bleeding. I looked at colostomy bag it is not bleeding. I told pt that they was working on getting her back. Pt bag is not leaking or bleeding. Nurse was made aware and nurse looked at bag.

## 2020-07-03 NOTE — ED Notes (Signed)
Patient moaning in WR, this RN assessed patient who stated her colostomy was bleeding, no obvious bleeding noted at colostomy site, erythema present with small amount of stool in bag. Pts vitals stable, pt reassured we are working to get her back to be seen by provider as soon as we can.

## 2021-05-10 ENCOUNTER — Ambulatory Visit: Payer: Self-pay | Admitting: Podiatry

## 2021-05-23 ENCOUNTER — Ambulatory Visit: Payer: Medicare Other | Admitting: Podiatry

## 2021-08-26 ENCOUNTER — Ambulatory Visit: Payer: Medicare Other | Admitting: Podiatry

## 2022-03-06 DEATH — deceased

## 8387-06-07 DEATH — deceased
